# Patient Record
Sex: Male | Born: 1980 | Race: White | Hispanic: No | Marital: Married | Smoking: Former smoker
Health system: Southern US, Community
[De-identification: ages and names within clinical notes are randomized; demographics above are authoritative.]

## PROBLEM LIST (undated history)

## (undated) DIAGNOSIS — R002 Palpitations: Secondary | ICD-10-CM

## (undated) DIAGNOSIS — F988 Other specified behavioral and emotional disorders with onset usually occurring in childhood and adolescence: Secondary | ICD-10-CM

## (undated) DIAGNOSIS — K219 Gastro-esophageal reflux disease without esophagitis: Secondary | ICD-10-CM

## (undated) DIAGNOSIS — I4510 Unspecified right bundle-branch block: Secondary | ICD-10-CM

## (undated) DIAGNOSIS — Z973 Presence of spectacles and contact lenses: Secondary | ICD-10-CM

## (undated) DIAGNOSIS — Q676 Pectus excavatum: Secondary | ICD-10-CM

## (undated) HISTORY — PX: NOSE SURGERY: SHX723

## (undated) HISTORY — DX: Palpitations: R00.2

---

## 2016-10-20 ENCOUNTER — Encounter: Payer: Self-pay | Admitting: Cardiovascular Disease

## 2016-10-20 ENCOUNTER — Telehealth: Payer: Self-pay | Admitting: Cardiovascular Disease

## 2016-10-20 ENCOUNTER — Ambulatory Visit (INDEPENDENT_AMBULATORY_CARE_PROVIDER_SITE_OTHER): Payer: Commercial Managed Care - PPO | Admitting: Cardiovascular Disease

## 2016-10-20 VITALS — BP 128/89 | HR 92 | Ht 70.0 in | Wt 188.4 lb

## 2016-10-20 DIAGNOSIS — R002 Palpitations: Secondary | ICD-10-CM

## 2016-10-20 DIAGNOSIS — Q676 Pectus excavatum: Secondary | ICD-10-CM | POA: Diagnosis not present

## 2016-10-20 DIAGNOSIS — R0789 Other chest pain: Secondary | ICD-10-CM | POA: Diagnosis not present

## 2016-10-20 DIAGNOSIS — I4510 Unspecified right bundle-branch block: Secondary | ICD-10-CM

## 2016-10-20 NOTE — Telephone Encounter (Signed)
[facility] November 10, 2016

## 2016-10-20 NOTE — Progress Notes (Signed)
CARDIOLOGY CONSULT NOTE  Patient ID: Marcus Ellis MRN: [id number] DOB/AGE: Aug 09, 1980 36 y.o.  Admit date: (Not on file) Primary Physician: Selinda Flavin, MD Referring Physician: Dimas Aguas  Reason for Consultation: palpitations, abnormal ECG  HPI: Marcus Ellis is a 36 y.o. male who is being seen today for the evaluation of palpitations and an abnormal ECG at the request of Selinda Flavin, MD.   ECG performed on 10/15/16 which I personally interpreted demonstrated sinus rhythm with right bundle branch block. There were nonspecific Q waves.  He has a history of pectus excavatum. He is a Engineer, civil (consulting) by profession. He has had palpitations off and on for the past 2 years but has experienced them moreso in the past one month. He said "I am conscious of my heart the time". He thinks some of this may be related to anxiety.   He had ECGs performed on himself while at work which showed sinus rhythm with a right bundle branch block. He has never captured any arrhythmias per se.   He was once participating in an activity in [place] outdoors where it was hot and humid and he noted his heart rate to be 130 bpm by his watch. He tried adequately hydrate himself that day.   He has been experiencing a "discomfort "in his chest but has had only one episode of mild chest pain. This did not occur with exertion. He describes it as a uncomfortable feeling in his chest. He denies associated shortness of breath, lightheadedness, dizziness, syncope, and leg edema. He has never worn a monitor per se. Allograft he used to drink a lot of caffeine but now only drinks one coffee every morning. He used to drink a 15 ounce energy drink and 2 Cokes daily.  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    Allergies  Allergen Reactions  . Bee Venom Hives  . Penicillins Other (See Comments)    Happened as a child. Unknown     Current Outpatient Prescriptions  Medication Sig Dispense Refill  .  amphetamine-dextroamphetamine (ADDERALL) 20 MG tablet Take 20 mg by mouth daily.     No current facility-administered medications for this visit.     Past Medical History:  Diagnosis Date  . Palpitations     Past Surgical History:  Procedure Laterality Date  . NOSE SURGERY      Social History   Social History  . Marital status: Married    Spouse name: N/A  . Number of children: N/A  . Years of education: N/A   Occupational History  . Not on file.   Social History Main Topics  . Smoking status: Never Smoker  . Smokeless tobacco: Current User  . Alcohol use Not on file  . Drug use: Unknown  . Sexual activity: Not on file   Other Topics Concern  . Not on file   Social History Narrative  . No narrative on file     No family history of premature CAD in 1st degree relatives.  No outpatient prescriptions have been marked as taking for the 10/20/16 encounter (Office Visit) with Laqueta Linden, MD.      Review of systems complete and found to be negative unless listed above in HPI    Physical exam Blood pressure 128/89, pulse 92, height 5\' 10"  (1.778 m), weight 188 lb 6.4 oz (85.5 kg), SpO2 98 %. General: NAD Neck: No JVD, no thyromegaly or thyroid nodule.  Lungs: Clear to auscultation bilaterally with  normal respiratory effort. CV: Pectus excavatum. Nondisplaced PMI. Regular rate and rhythm, normal S1/S2, no S3/S4, no murmur.  No peripheral edema.  No carotid bruit.    Abdomen: Soft, nontender, no distention.  Skin: Intact without lesions or rashes.  Neurologic: Alert and oriented x 3.  Psych: Normal affect. Extremities: No clubbing or cyanosis.  HEENT: Normal.   ECG: Most recent ECG reviewed.   Labs: No results found for: K, BUN, CREATININE, ALT, TSH, HGB   Lipids: No results found for: LDLCALC, LDLDIRECT, CHOL, TRIG, HDL      ASSESSMENT AND PLAN:  1. Right bundle branch block: This is likely related to the anatomic position and rotation of  the heart related to pectus excavatum.  2. Chest discomfort and palpitations: I will order a 2-D echocardiogram with Doppler to evaluate cardiac structure, function, and regional wall motion. The likelihood of ischemic heart disease is exceedingly low. A stress test is not indicated. I told him if he is having forceful palpitations or rapid heart rates while working, to try and obtain an ECG in real time. He has no pulmonary symptoms to warrant pulmonary function testing.  Disposition: Follow up in 2 months  Signed: Prentice DockerSuresh Abrar Koone, M.D., F.A.C.C.  10/20/2016, 10:29 AM

## 2016-10-20 NOTE — Patient Instructions (Signed)
Medication Instructions:  Continue all current medications.  Labwork: none  Testing/Procedures:  Your physician has requested that you have an echocardiogram. Echocardiography is a painless test that uses sound waves to create images of your heart. It provides your doctor with information about the size and shape of your heart and how well your heart's chambers and valves are working. This procedure takes approximately one hour. There are no restrictions for this procedure.  Office will contact with results via phone or letter.    Follow-Up: 2 months   Any Other Special Instructions Will Be Listed Below (If Applicable).  If you need a refill on your cardiac medications before your next appointment, please call your pharmacy.  

## 2016-11-10 ENCOUNTER — Ambulatory Visit (INDEPENDENT_AMBULATORY_CARE_PROVIDER_SITE_OTHER): Payer: Commercial Managed Care - PPO

## 2016-11-10 ENCOUNTER — Other Ambulatory Visit: Payer: Self-pay

## 2016-11-10 DIAGNOSIS — R0789 Other chest pain: Secondary | ICD-10-CM

## 2016-11-10 DIAGNOSIS — I4510 Unspecified right bundle-branch block: Secondary | ICD-10-CM | POA: Diagnosis not present

## 2016-11-12 ENCOUNTER — Telehealth: Payer: Self-pay

## 2016-11-12 ENCOUNTER — Telehealth: Payer: Self-pay | Admitting: Cardiovascular Disease

## 2016-11-12 NOTE — Telephone Encounter (Signed)
Patient called requesting test results of recent echo.  °

## 2016-11-12 NOTE — Telephone Encounter (Signed)
Patient notified. Routed to PCP. Appointment made for 12/03/16

## 2016-11-12 NOTE — Telephone Encounter (Signed)
Patient informed that his echo has not been reviewed by the doctor yet but once they are reviewed that he would be contacted with the result either by email or call. Patient verbalized understanding.

## 2016-11-12 NOTE — Telephone Encounter (Signed)
-----   Message from Antoine PocheJonathan F Branch, MD sent at 11/12/2016  2:53 PM EDT ----- Echo shows a mild decrease in the pumping function of his heart. This may require some additional testing Dr Kirtland BouchardK to discuss further at f/u, can we move his appt from August to 2-3 weeks.   Dominga FerryJ Branch MD

## 2016-11-16 ENCOUNTER — Telehealth: Payer: Self-pay | Admitting: Cardiovascular Disease

## 2016-11-16 NOTE — Telephone Encounter (Signed)
Patients wife Victorino DikeJennifer notified per Dr. Wyline MoodBranch result note further testing may be required. No plan has been set in stone. Will be discussed further at follow up appointment with Dr. Purvis SheffieldKoneswaran

## 2016-11-16 NOTE — Telephone Encounter (Signed)
Patient is concerned about test results   Would like to know about what testing maybe ordered

## 2016-12-03 ENCOUNTER — Encounter: Payer: Self-pay | Admitting: Cardiovascular Disease

## 2016-12-03 ENCOUNTER — Ambulatory Visit (INDEPENDENT_AMBULATORY_CARE_PROVIDER_SITE_OTHER): Payer: Commercial Managed Care - PPO | Admitting: Cardiovascular Disease

## 2016-12-03 VITALS — BP 124/76 | HR 95 | Wt 181.0 lb

## 2016-12-03 DIAGNOSIS — R002 Palpitations: Secondary | ICD-10-CM

## 2016-12-03 DIAGNOSIS — I429 Cardiomyopathy, unspecified: Secondary | ICD-10-CM | POA: Diagnosis not present

## 2016-12-03 DIAGNOSIS — I4510 Unspecified right bundle-branch block: Secondary | ICD-10-CM | POA: Diagnosis not present

## 2016-12-03 DIAGNOSIS — R0789 Other chest pain: Secondary | ICD-10-CM | POA: Diagnosis not present

## 2016-12-03 DIAGNOSIS — Q676 Pectus excavatum: Secondary | ICD-10-CM

## 2016-12-03 NOTE — Patient Instructions (Addendum)
Your physician recommends that you schedule a follow-up appointment in: 3 months with Dr Purvis SheffieldKoneswaran   Your physician has requested that you have an exercise tolerance test. For further information please visit [email or web address]. Please also follow instruction sheet, as given.   Your physician has recommended that you have a pulmonary function test. Pulmonary Function Tests are a group of tests that measure how well air moves in and out of your lungs.   Please schedule chest CT at front desk, [facility]     You have been referred to cardiothoracic surgeon , [facility]  [address], [place], [phone], they will call you for an apt       Thank you for choosing [facility] !

## 2016-12-03 NOTE — Progress Notes (Signed)
SUBJECTIVE: The patient returns for follow-up after undergoing cardiovascular testing performed for the evaluation of chest discomfort and palpitations.  He has a history of pectus excavatum. He is a Engineer, civil (consulting) by profession.  Echocardiogram 11/10/16 demonstrated mildly reduced left ventricular systolic function, LVEF 40-45%, diffuse hypokinesis, and ventricular septal flattening in systole suggestive of right ventricular pressure overload.  That being said, there was no evidence of significant tricuspid regurgitation and right ventricular cavity size was normal.  He is here with his wife. They both have several questions.  He said his chest pain and palpitations have improved. He denies lightheadedness, dizziness, and syncope. He remains very physically active and is able to exercise without difficulty. He was mowing his lawn and they live [place] and when he was walking uphill he questioned whether he was short of breath or not, or whether he was just anxious about the echocardiogram results and this led to shortness of breath.  He tells me lipids have been checked and were normal by his PCP.   Review of Systems: As per "subjective", otherwise negative.  Allergies  Allergen Reactions  . Bee Venom Hives  . Penicillins Other (See Comments)    Happened as a child. Unknown     Current Outpatient Prescriptions  Medication Sig Dispense Refill  . amphetamine-dextroamphetamine (ADDERALL) 20 MG tablet Take 20 mg by mouth daily.     No current facility-administered medications for this visit.     Past Medical History:  Diagnosis Date  . Palpitations     Past Surgical History:  Procedure Laterality Date  . NOSE SURGERY      Social History   Social History  . Marital status: Married    Spouse name: N/A  . Number of children: N/A  . Years of education: N/A   Occupational History  . Not on file.   Social History Main Topics  . Smoking status: Never Smoker  . Smokeless  tobacco: Current User  . Alcohol use Not on file  . Drug use: Unknown  . Sexual activity: Not on file   Other Topics Concern  . Not on file   Social History Narrative  . No narrative on file     There were no vitals filed for this visit.  Wt Readings from Last 3 Encounters:  10/20/16 188 lb 6.4 oz (85.5 kg)     PHYSICAL EXAM General: NAD HEENT: Normal. Neck: No JVD, no thyromegaly. Lungs: Clear to auscultation bilaterally with normal respiratory effort. CV: Pectus excavatum. Regular rate and rhythm, normal S1/S2, no S3/S4, no murmur. No pretibial or periankle edema.   Abdomen: Soft, nontender, no distention.  Neurologic: Alert and oriented.  Psych: Normal affect. Skin: Normal. Musculoskeletal: No gross deformities.    ECG: Most recent ECG reviewed.   Labs: No results found for: K, BUN, CREATININE, ALT, TSH, HGB   Lipids: No results found for: LDLCALC, LDLDIRECT, CHOL, TRIG, HDL     ASSESSMENT AND PLAN:  1. 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 Subjectively, it does not appear to have limited exercise tolerance in that he has been able to participate in several fitness related activities.  I will obtain a GXT to objectively assess exercise tolerance. I will obtain a chest CT to quantify the degree of pectus excavatum as well as pulmonary function testing to assess for restrictive lung disease. After these studies are obtained, I will make a referral  to CT surgery to assess surgical candidacy. The pretest probability of ischemic heart disease is exceedingly low. As his cardiomyopathy appears to be mechanically related, I do not feel metoprolol succinate, carvedilol, or bisoprolol would necessarily help to improve cardiac function and may only lead to symptoms related to side effects (fatigue, etc), given his overall good health.  2. Right bundle branch block: This is likely  related to the anatomic position and rotation of the heart related to pectus excavatum.   Disposition: Follow up 3 months.  Time spent: 40 minutes, of which greater than 50% was spent reviewing symptoms, relevant blood tests and studies, and discussing management plan with the patient.    Prentice DockerSuresh Jahan Friedlander, M.D., F.A.C.C.

## 2016-12-16 ENCOUNTER — Encounter: Payer: Self-pay | Admitting: *Deleted

## 2016-12-16 ENCOUNTER — Telehealth: Payer: Self-pay | Admitting: *Deleted

## 2016-12-16 DIAGNOSIS — Q676 Pectus excavatum: Secondary | ICD-10-CM

## 2016-12-16 NOTE — Telephone Encounter (Signed)
Does this chest CT need to be with or without contrast? Both orders were placed radiology says they cannot do both (to much radiation)

## 2016-12-16 NOTE — Telephone Encounter (Signed)
A nonconctrast CT should be fine  Dominga FerryJ Christpher Stogsdill MD

## 2016-12-16 NOTE — Telephone Encounter (Signed)
Orders placed for CT without contrast

## 2016-12-17 ENCOUNTER — Ambulatory Visit (HOSPITAL_COMMUNITY)
Admission: RE | Admit: 2016-12-17 | Discharge: 2016-12-17 | Disposition: A | Discharge status: Home / Self Care | Payer: Commercial Managed Care - PPO | Source: Ambulatory Visit | Attending: Cardiovascular Disease | Admitting: Cardiovascular Disease

## 2016-12-17 DIAGNOSIS — Q676 Pectus excavatum: Secondary | ICD-10-CM

## 2016-12-17 DIAGNOSIS — I429 Cardiomyopathy, unspecified: Secondary | ICD-10-CM | POA: Diagnosis present

## 2016-12-17 MED ORDER — ALBUTEROL SULFATE (2.5 MG/3ML) 0.083% IN NEBU
2.5000 mg | INHALATION_SOLUTION | Freq: Once | RESPIRATORY_TRACT | Status: AC
  Administered 2016-12-17: 2.5 mg via RESPIRATORY_TRACT

## 2016-12-20 ENCOUNTER — Ambulatory Visit (HOSPITAL_COMMUNITY)
Admission: RE | Admit: 2016-12-20 | Discharge: 2016-12-20 | Disposition: A | Discharge status: Home / Self Care | Payer: Commercial Managed Care - PPO | Source: Ambulatory Visit | Attending: Cardiovascular Disease | Admitting: Cardiovascular Disease

## 2016-12-20 DIAGNOSIS — I429 Cardiomyopathy, unspecified: Secondary | ICD-10-CM | POA: Diagnosis not present

## 2016-12-20 DIAGNOSIS — Q676 Pectus excavatum: Secondary | ICD-10-CM | POA: Insufficient documentation

## 2016-12-20 LAB — EXERCISE TOLERANCE TEST
CSEPED: 12 min
CSEPEW: 13.4 METS
CSEPHR: 91 %
Exercise duration (sec): 0 s
MPHR: 186 {beats}/min
Peak HR: 171 {beats}/min
RPE: 14
Rest HR: 86 {beats}/min

## 2016-12-21 LAB — PULMONARY FUNCTION TEST
DL/VA % pred: 46 %
DL/VA: 2.15 ml/min/mmHg/L
DLCO UNC: 8.67 ml/min/mmHg
DLCO cor % pred: 26 %
DLCO cor: 8.67 ml/min/mmHg
DLCO unc % pred: 26 %
FEF 25-75 Post: 5.14 L/sec
FEF 25-75 Pre: 4.38 L/sec
FEF2575-%Change-Post: 17 %
FEF2575-%Pred-Post: 120 %
FEF2575-%Pred-Pre: 102 %
FEV1-%CHANGE-POST: 2 %
FEV1-%PRED-POST: 103 %
FEV1-%Pred-Pre: 100 %
FEV1-POST: 4.5 L
FEV1-PRE: 4.38 L
FEV1FVC-%Change-Post: 5 %
FEV1FVC-%Pred-Pre: 101 %
FEV6-%Change-Post: -2 %
FEV6-%PRED-PRE: 100 %
FEV6-%Pred-Post: 97 %
FEV6-POST: 5.18 L
FEV6-Pre: 5.33 L
FEV6FVC-%PRED-POST: 102 %
FEV6FVC-%Pred-Pre: 102 %
FVC-%CHANGE-POST: -2 %
FVC-%Pred-Post: 95 %
FVC-%Pred-Pre: 98 %
FVC-POST: 5.18 L
FVC-Pre: 5.33 L
POST FEV6/FVC RATIO: 100 %
PRE FEV6/FVC RATIO: 100 %
Post FEV1/FVC ratio: 87 %
Pre FEV1/FVC ratio: 82 %
RV % PRED: 96 %
RV: 1.66 L
TLC % PRED: 97 %
TLC: 6.71 L

## 2016-12-22 ENCOUNTER — Telehealth: Payer: Self-pay

## 2016-12-22 NOTE — Telephone Encounter (Signed)
No answer, left message for pt to return call. 

## 2016-12-22 NOTE — Telephone Encounter (Signed)
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

## 2016-12-24 ENCOUNTER — Ambulatory Visit: Payer: Commercial Managed Care - PPO | Admitting: Cardiovascular Disease

## 2017-01-11 ENCOUNTER — Encounter: Payer: Commercial Managed Care - PPO | Admitting: Cardiothoracic Surgery

## 2017-01-12 ENCOUNTER — Encounter: Payer: Commercial Managed Care - PPO | Admitting: Cardiothoracic Surgery

## 2017-01-13 ENCOUNTER — Encounter: Payer: Self-pay | Admitting: Cardiothoracic Surgery

## 2017-01-13 ENCOUNTER — Other Ambulatory Visit: Payer: Self-pay | Admitting: *Deleted

## 2017-01-13 ENCOUNTER — Institutional Professional Consult (permissible substitution) (INDEPENDENT_AMBULATORY_CARE_PROVIDER_SITE_OTHER): Payer: Commercial Managed Care - PPO | Admitting: Cardiothoracic Surgery

## 2017-01-13 VITALS — BP 140/90 | HR 96 | Resp 20 | Ht 71.0 in | Wt 180.0 lb

## 2017-01-13 DIAGNOSIS — Q676 Pectus excavatum: Secondary | ICD-10-CM | POA: Diagnosis not present

## 2017-01-13 DIAGNOSIS — Z01818 Encounter for other preprocedural examination: Secondary | ICD-10-CM

## 2017-01-13 DIAGNOSIS — R942 Abnormal results of pulmonary function studies: Secondary | ICD-10-CM

## 2017-01-13 NOTE — Progress Notes (Signed)
[address]       [place] [address]             [phone]                    Ching Rabideau [facility] Medical Record #[id number] Date of Birth: [birth year]  Referring: Laqueta Linden, MD Primary Care: Selinda Flavin, MD  Chief Complaint:    Chief Complaint  Patient presents with  . Advice Only    Surgical eval on pectus excavatum, Chest CT 12/17/16    History of Present Illness:    Marcus Ellis 36 y.o. male is seen in the office  today for evaluation of recent history of palpitations and exertional dyspnea. The patient has a history of known pectus excavatum and hasn't been evaluated multiple times in the past including in 2008 well he was in the [employer]. He notes his pulmonary function studies were normal at that time that we do not have copies of any this medical records. The patient works as a Engineer, civil (consulting) and at work 1 day. EKG on himself and noticed a right bundle branch block, he did this because he noted palpitations and increasing shortness of breath with exertion. Cardiology evaluation was performed including echocardiogram which showed a decreased ejection fraction 40-50%. CT scan of the chest was performed demonstrating a Haller index of 3.7. Stress test was negative for ischemia. PFTs were done demonstrating normal volumes and significantly decreased diffusion capacity. The patient is referred for consideration of pectus repair.   The patient is a previous smoker, up to a pack a day for 3-5 years quit in 2006. Growing up visit child he denies any asthmatic lung disease. Participated in sports including basketball wrestling and was always able to keep up with his peers.    Current Activity/ Functional Status:  Patient is independent with mobility/ambulation, transfers, ADL's, IADL's.   Zubrod Score: At the time of surgery this patient's most appropriate activity status/level should be described as: [x]     0    Normal activity, no symptoms []      1    Restricted in physical strenuous activity but ambulatory, able to do out light work []     2    Ambulatory and capable of self care, unable to do work activities, up and about               >50 % of waking hours                              []     3    Only limited self care, in bed greater than 50% of waking hours []     4    Completely disabled, no self care, confined to bed or chair []     5    Moribund   Past Medical History:  Diagnosis Date  . Palpitations     Past Surgical History:  Procedure Laterality Date  . NOSE SURGERY      Family History  Problem Relation Age of Onset  . High blood pressure Mother   . High blood pressure Father     Social History   Social History  . Marital status: Married    Spouse name: N/A  . Number of children: N/A  . Years of education: N/A   Occupational History  . Not on file.   Social History Main Topics  .  Smoking status: Smoked 3-5 years quit 2006   . Smokeless tobacco: Current User  . Alcohol use Not on file  . Drug use: Unknown  . Sexual activity: Not on file   Other Topics Concern  . Not on file   Social History Narrative  . No narrative on file    History  Smoking Status  . Never Smoker  Smokeless Tobacco  . Current User    History  Alcohol use Not on file     Allergies  Allergen Reactions  . Bee Venom Hives  . Penicillins Other (See Comments)    Happened as a child. Unknown     Current Outpatient Prescriptions  Medication Sig Dispense Refill  . amphetamine-dextroamphetamine (ADDERALL) 20 MG tablet Take 20 mg by mouth daily.     No current facility-administered medications for this visit.     Pertinent items are noted in HPI.   Review of Systems:     Cardiac Review of Systems: Y or N  Chest Pain [ y   ]  Resting SOB Cove.Etienne   ] Exertional SOB  Cove.Etienne  ]  Orthopnea Cove.Etienne  ]   Pedal Edema [ n  ]    Palpitations [ y ] Syncope  [ n ]   Presyncope [n   ]  General Review of Systems: [Y] = yes [   ]=no Constitional: recent weight change [  ];  Wt loss over the last 3 months [   ] anorexia [  ]; fatigue [  ]; nausea [  ]; night sweats [  ]; fever [  ]; or chills [  ];          Dental: poor dentition[  ]; Last Dentist visit:   Eye : blurred vision [  ]; diplopia [   ]; vision changes [  ];  Amaurosis fugax[  ]; Resp: cough [  ];  wheezing[ n ];  hemoptysis[ n ]; shortness of breath[ y ]; paroxysmal nocturnal dyspnea[  ]; dyspnea on exertion[  ]; or orthopnea[  ];  GI:  gallstones[  ], vomiting[  ];  dysphagia[  ]; melena[  ];  hematochezia [  ]; heartburn[  ];   Hx of  Colonoscopy[  ]; GU: kidney stones [  ]; hematuria[  ];   dysuria [  ];  nocturia[  ];  history of     obstruction [  ]; urinary frequency [  ]             Skin: rash, swelling[  ];, hair loss[  ];  peripheral edema[  ];  or itching[  ]; Musculosketetal: myalgias[  ];  joint swelling[  ];  joint erythema[  ];  joint pain[  ];  back pain[  ];  Heme/Lymph: bruising[  ];  bleeding[  ];  anemia[  ];  Neuro: TIA[  ];  headaches[  ];  stroke[  ];  vertigo[  ];  seizures[  ];   paresthesias[  ];  difficulty walking[  ];  Psych:depression[  ]; anxiety[ y ];  Endocrine: diabetes[ n ];  thyroid dysfunction[ n ];  Immunizations: Flu up to date [  ]; Pneumococcal up to date [  ];  Other:  Physical Exam: BP 140/90   Pulse 96   Resp 20   Ht 5\' 11"  (1.803 m)   Wt 180 lb (81.6 kg)   SpO2 98% Comment: RA  BMI 25.10 kg/m   PHYSICAL EXAMINATION: General appearance: alert, cooperative,  appears stated age and no distress Head: Normocephalic, without obvious abnormality, atraumatic Neck: no adenopathy, no carotid bruit, no JVD, supple, symmetrical, trachea midline and thyroid not enlarged, symmetric, no tenderness/mass/nodules Lymph nodes: Cervical, supraclavicular, and axillary nodes normal. Resp: clear to auscultation bilaterally Back: symmetric, no curvature. ROM normal. No CVA tenderness. Cardio: regular rate and rhythm, S1, S2  normal, no murmur, click, rub or gallop GI: soft, non-tender; bowel sounds normal; no masses,  no organomegaly Extremities: extremities normal, atraumatic, no cyanosis or edema and Homans sign is negative, no sign of DVT Neurologic: Grossly normal Chest:      Diagnostic Studies & Laboratory data:     Recent Radiology Findings:   Ct Chest Wo Contrast  Addendum Date: 12/21/2016   ADDENDUM REPORT: 12/21/2016 12:59 ADDENDUM: Addendum created to address request for Haller index: Transverse diameter of thorax:  (image 106) AP diameter of thorax: 76mm Haller Index: 3.7 Electronically Signed   By: Gilmer Mor D.O.   On: 12/21/2016 12:59   Result Date: 12/21/2016 CLINICAL DATA:  36 year old male with a history of pectus excavatum. Shortness of breath EXAM: CT CHEST WITHOUT CONTRAST TECHNIQUE: Multidetector CT imaging of the chest was performed following the standard protocol without IV contrast. COMPARISON:  None. FINDINGS: Cardiovascular: No significant vascular findings. Normal heart size. No pericardial effusion. Mediastinum/Nodes: No enlarged mediastinal or axillary lymph nodes. Thyroid gland, trachea, and esophagus demonstrate no significant findings. 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 Lungs/Pleura: Lungs are clear. No pleural effusion or pneumothorax. Upper Abdomen: No acute abnormality. Musculoskeletal: No chest wall mass or suspicious bone lesions identified. IMPRESSION: No acute CT finding. Electronically Signed: By: Gilmer Mor D.O. On: 12/17/2016 16:42  I have independently reviewed the above radiologic studies.    PFT's  FEV1 4.38 100% DLCO 8.67  26% Interpretation: 1. spirometry is normal.no bronchodilator improvement. 2.lung volumes are normal 3. airway resistance is elevated. 4.DLCO severely reduced    echocardiogram Transthoracic Echocardiography  Patient:    Pat, Sires MR #:       [id number] Study Date: 11/10/2016 Gender:     M Age:         36 Height:     177.8 cm Weight:     85.5 kg BSA:        2.07 m^2 Pt. Status: Room:   SONOGRAPHER  [facility]  ATTENDING    Prentice Docker, MD  ORDERING     Prentice Docker, MD  REFERRING    Prentice Docker, MD  PERFORMING   [facility], [place]  cc:  ------------------------------------------------------------------- LV EF: 40% -   45%  ------------------------------------------------------------------- History:   PMH:   Chest pain. RBBB.  Risk factors:  Lifelong nonsmoker.  ------------------------------------------------------------------- Study Conclusions  - Left ventricle: The cavity size was normal. Wall thickness was   normal. Systolic function was mildly to moderately reduced. The   estimated ejection fraction was in the range of 40% to 45%.   Diffuse hypokinesis. The study is not technically sufficient to   allow evaluation of LV diastolic function. There was no evidence   of elevated ventricular filling pressure by Doppler parameters. - Aortic valve: Valve area (VTI): 3.85 cm^2. Valve area (Vmax):   3.12 cm^2. Valve area (Vmean): 3.22 cm^2. - Right ventricle: The ventricular septum is flattened in systole,   suggesting RV pressure overload.  ------------------------------------------------------------------- Study data:  No prior study was available for comparison.  Study status:  Routine.  Procedure:  The patient reported no pain pre or post  test. Transthoracic echocardiography. Image quality was adequate.  Study completion:  There were no complications. Transthoracic echocardiography.  M-mode, complete 2D, spectral Doppler, and color Doppler.  Birthdate:  Patient birthdate: 1980/10/18.  Age:  Patient is 36 yr old.  Sex:  Gender: male. BMI: 27 kg/m^2.  Blood pressure:     128/89  Patient status: Outpatient.  Study date:  Study date: 11/10/2016. Study time:  04:21 PM.  -------------------------------------------------------------------  ------------------------------------------------------------------- Left ventricle:  The cavity size was normal. Wall thickness was normal. Systolic function was mildly to moderately reduced. The estimated ejection fraction was in the range of 40% to 45%. Diffuse hypokinesis. The study is not technically sufficient to allow evaluation of LV diastolic function. There was no evidence of elevated ventricular filling pressure by Doppler parameters.  ------------------------------------------------------------------- Aortic valve:   Trileaflet; normal thickness leaflets.  Doppler: There was no stenosis.   There was no significant regurgitation. VTI ratio of LVOT to aortic valve: 0.85. Valve area (VTI): 3.85 cm^2. Indexed valve area (VTI): 1.86 cm^2/m^2. Peak velocity ratio of LVOT to aortic valve: 0.69. Valve area (Vmax): 3.12 cm^2. Indexed valve area (Vmax): 1.51 cm^2/m^2. Mean velocity ratio of LVOT to aortic valve: 0.71. Valve area (Vmean): 3.22 cm^2. Indexed valve area (Vmean): 1.56 cm^2/m^2.    Mean gradient (S): 2 mm Hg. Peak gradient (S): 4 mm Hg.  ------------------------------------------------------------------- Aorta:  Aortic root: The aortic root was normal in size.  ------------------------------------------------------------------- Mitral valve:   Normal thickness leaflets .  Doppler:   There was no evidence for stenosis.   There was no significant regurgitation.   ------------------------------------------------------------------- Left atrium:  The atrium was normal in size.  ------------------------------------------------------------------- Atrial septum:  Poorly visualized.  ------------------------------------------------------------------- Right ventricle:  The ventricular septum is flattened in systole, suggesting RV pressure overload. The cavity size was normal. Systolic  function was normal.  ------------------------------------------------------------------- Pulmonic valve:   Not well visualized.  Doppler:   There was no evidence for stenosis.   There was no significant regurgitation.   ------------------------------------------------------------------- Tricuspid valve:   Normal thickness leaflets.  Doppler:   There was no evidence for stenosis.   There was trivial regurgitation.  ------------------------------------------------------------------- Pulmonary artery:    Systolic pressure could not be accurately estimated.   Inadequate TR jet.  ------------------------------------------------------------------- Right atrium:  The atrium was normal in size.  ------------------------------------------------------------------- Pericardium:  Not well visualized. There was no pericardial effusion.  ------------------------------------------------------------------- Measurements   Left ventricle                           Value           Reference  LV ID, ED, PLAX chordal                  47.5   mm       43 - 52  LV ID, ES, PLAX chordal                  37.4   mm       23 - 38  LV fx shortening, PLAX chordal    (L)    21     %        >=29  LV PW thickness, ED                      9      mm       ---------  IVS/LV PW ratio, ED  0.95            <=1.3  Longitudinal strain, TDI                 15     %        ---------    Ventricular septum                       Value           Reference  IVS thickness, ED                        8.52   mm       ---------    LVOT                                     Value           Reference  LVOT ID, S                               24     mm       ---------  LVOT area                                4.52   cm^2     ---------  LVOT peak velocity, S                    71.7   cm/s     ---------  LVOT mean velocity, S                    48.3   cm/s     ---------  LVOT VTI, S                               12.6   cm       ---------    Aortic valve                             Value           Reference  Aortic valve peak velocity, S            104.62 cm/s     ---------  Aortic valve mean velocity, S            67.8   cm/s     ---------  Aortic valve VTI, S                      14.86  cm       ---------  Aortic mean gradient, S                  2      mm Hg    ---------  Aortic peak gradient, S                  4      mm Hg    ---------  VTI ratio, LVOT/AV  0.85            ---------  Aortic valve area, VTI                   3.85   cm^2     ---------  Aortic valve area/bsa, VTI               1.86   cm^2/m^2 ---------  Velocity ratio, peak, LVOT/AV            0.69            ---------  Aortic valve area, peak velocity         3.12   cm^2     ---------  Aortic valve area/bsa, peak              1.51   cm^2/m^2 ---------  velocity  Velocity ratio, mean, LVOT/AV            0.71            ---------  Aortic valve area, mean velocity         3.22   cm^2     ---------  Aortic valve area/bsa, mean              1.56   cm^2/m^2 ---------  velocity    Aorta                                    Value           Reference  Aortic root ID, ED                       34     mm       ---------    Left atrium                              Value           Reference  LA ID, A-P, ES                           29     mm       ---------  LA volume/bsa, ES, 1-p A4C               15.6   ml/m^2   ---------    Mitral valve                             Value           Reference  Mitral E-wave peak velocity              43.2   cm/s     ---------  Mitral A-wave peak velocity              48     cm/s     ---------  Mitral deceleration time                 158    ms       150 - 230  Mitral E/A ratio, peak                   0.9             ---------  Mitral maximal regurg velocity,          99.1   cm/s     ---------  PISA    Right ventricle                          Value           Reference  TAPSE                                     18     mm       ---------  RV s&', lateral, S                        13.2   cm/s     ---------  Legend: (L)  and  (H)  mark values outside specified reference range.  ------------------------------------------------------------------- Prepared and Electronically Authenticated by  Patrick Jupiter, M.D. 2018-06-20T18:50:41  Study Highlights    Blood pressure demonstrated a normal response to exercise.  There was no ST segment deviation noted during stress. Negative ekg stress test for ischemia  Duke treadmill score of 12 consistent with low risk for major cardiac events.    Stress Findings   ECG Baseline ECG exhibits normal sinus rhythm.Baseline ECG indicates right bundle branch block. .    Stress Findings The patient exercised following the Bruce protocol.  The patient reported no symptoms during the stress test. The patient experienced no angina during the stress test.   Test was stopped per protocol.   Blood pressure and heart rate demonstrated a normal response to exercise. Blood pressure demonstrated a normal response to exercise. Overall, the patient's exercise capacity was normal.   85% of maximum heart rate was achieved after 10 minutes. Recovery time: 7.3 minutes. The patient's response to exercise was adequate for diagnosis.    Response to Stress There was no ST segment deviation noted during stress.  Arrhythmias during stress: none.  Arrhythmias during recovery: none.  There were no significant arrhythmias noted during the test.  ECG was interpretable and conclusive.    Stress Measurements   Baseline Vitals  Rest HR 86 bpm    Rest BP 130/92 mmHg    Exercise Time  Exercise duration (min) 12 min    Exercise duration (sec) 0 sec    Peak Stress Vitals  Peak HR 171 bpm    Peak BP 173/99 mmHg    Exercise Data  MPHR 186 bpm    Percent HR 91 %    RPE 14     Estimated workload 13.4 METS       Signed   Electronically  signed by Antoine Poche, MD on 12/20/16 at 1422 EDT     Recent Lab Findings: No results found for: WBC, HGB, HCT, PLT, GLUCOSE, CHOL, TRIG, HDL, LDLDIRECT, LDLCALC, ALT, AST, NA, K, CL, CREATININE, BUN, CO2, TSH, INR, GLUF, HGBA1C    Assessment / Plan:   Patient with pectus excavatum with a Haller index of 3.7, significant diffusion capacity deficit on PFTs with normal volumes, depressed ejection fraction 40%. I've examined the patient and discussed with him the severity of his pectus. It's unclear if this diffusion capacity is related to the pectus or other causes. To further evaluate this we will obtain CPX testing. I discussed with the patient the surgical treatments including Nuss bar and modified Ravitch  type procedure. Also told the patient , but [facility] in [place] was the center of Nuss bar development. I will see him back after the CPX testing is completed and make further recommendations at that time.       I  spent 40 minutes counseling the patient face to face and 50% or more the  time was spent in counseling and coordination of care. The total time spent in the appointment was 60 minutes.  Delight Ovens MD      [address] [place] [place] [address] Office [phone]   Beeper [phone]  01/13/2017 11:31 AM

## 2017-02-01 ENCOUNTER — Ambulatory Visit (HOSPITAL_COMMUNITY): Payer: Commercial Managed Care - PPO | Attending: Cardiothoracic Surgery

## 2017-02-01 DIAGNOSIS — Z01818 Encounter for other preprocedural examination: Secondary | ICD-10-CM | POA: Insufficient documentation

## 2017-02-01 DIAGNOSIS — Q676 Pectus excavatum: Secondary | ICD-10-CM

## 2017-02-01 DIAGNOSIS — M954 Acquired deformity of chest and rib: Secondary | ICD-10-CM | POA: Insufficient documentation

## 2017-02-01 DIAGNOSIS — R942 Abnormal results of pulmonary function studies: Secondary | ICD-10-CM

## 2017-02-02 DIAGNOSIS — Q676 Pectus excavatum: Secondary | ICD-10-CM | POA: Diagnosis not present

## 2017-02-03 ENCOUNTER — Encounter: Payer: Self-pay | Admitting: Cardiothoracic Surgery

## 2017-02-03 ENCOUNTER — Ambulatory Visit (INDEPENDENT_AMBULATORY_CARE_PROVIDER_SITE_OTHER): Payer: Commercial Managed Care - PPO | Admitting: Cardiothoracic Surgery

## 2017-02-03 VITALS — BP 130/86 | HR 87 | Resp 16 | Ht 71.0 in | Wt 180.0 lb

## 2017-02-03 DIAGNOSIS — Q676 Pectus excavatum: Secondary | ICD-10-CM

## 2017-02-03 NOTE — Progress Notes (Signed)
[address]       [place] [address]             [phone]                    JAXSUN CIAMPI [facility] Medical Record #[id number] Date of Birth: December 22, 1980  Referring: Selinda Flavin, MD Primary Care: Selinda Flavin, MD  Chief Complaint:    Chief Complaint  Patient presents with  . Follow-up    after CPX testing    History of Present Illness:    Marcus Ellis 36 y.o. male is seen in the office  today for evaluation of recent history of palpitations and exertional dyspnea. The patient has a history of known pectus excavatum and hasn't been evaluated multiple times in the past including in 2008 well he was in the [employer]. He notes his pulmonary function studies were normal at that time , He provided copies today. The patient works as a Engineer, civil (consulting) and at work 1 day. EKG on himself and noticed a right bundle branch block, he did this because he noted palpitations and increasing shortness of breath with exertion. Cardiology evaluation was performed including echocardiogram which showed a decreased ejection fraction 40-50%. CT scan of the chest was performed demonstrating a Haller index of 3.7. Stress test was negative for ischemia. PFTs were done demonstrating normal volumes and significantly decreased diffusion capacity. The patient is referred for consideration of pectus repair.   Cardiopulmonary stress test has been preformed.  The patient is a previous smoker, up to a pack a day for 3-5 years quit in 2006. Growing up visit child he denies any asthmatic lung disease. Participated in sports including basketball wrestling and was always able to keep up with his peers.    Current Activity/ Functional Status:  Patient is independent with mobility/ambulation, transfers, ADL's, IADL's.   Zubrod Score: At the time of surgery this patient's most appropriate activity status/level should be described as: [x]     0    Normal activity, no symptoms []     1     Restricted in physical strenuous activity but ambulatory, able to do out light work []     2    Ambulatory and capable of self care, unable to do work activities, up and about               >50 % of waking hours                              []     3    Only limited self care, in bed greater than 50% of waking hours []     4    Completely disabled, no self care, confined to bed or chair []     5    Moribund   Past Medical History:  Diagnosis Date  . Palpitations     Past Surgical History:  Procedure Laterality Date  . NOSE SURGERY      Family History  Problem Relation Age of Onset  . High blood pressure Mother   . High blood pressure Father     Social History   Social History  . Marital status: Married    Spouse name: N/A  . Number of children: N/A  . Years of education: N/A   Occupational History  . Not on file.   Social History Main Topics  . Smoking status: Smoked 3-5  years quit 2006   . Smokeless tobacco: Current User  . Alcohol use Not on file  . Drug use: Unknown  . Sexual activity: Not on file   Other Topics Concern  . Not on file   Social History Narrative  . No narrative on file    History  Smoking Status  . Never Smoker  Smokeless Tobacco  . Current User    History  Alcohol use Not on file     Allergies  Allergen Reactions  . Bee Venom Hives  . Penicillins Other (See Comments)    Happened as a child. Unknown     Current Outpatient Prescriptions  Medication Sig Dispense Refill  . amphetamine-dextroamphetamine (ADDERALL) 20 MG tablet Take 20 mg by mouth daily.     No current facility-administered medications for this visit.     Pertinent items are noted in HPI.   Review of Systems:     Cardiac Review of Systems: Y or N  Chest Pain [ y   ]  Resting SOB Cove.Etienne   ] Exertional SOB  Cove.Etienne  ]  Orthopnea Cove.Etienne  ]   Pedal Edema [ n  ]    Palpitations [ y ] Syncope  [ n ]   Presyncope [n   ]  General Review of Systems: [Y] = yes [  ]=no Constitional:  recent weight change [  ];  Wt loss over the last 3 months [   ] anorexia [  ]; fatigue [  ]; nausea [  ]; night sweats [  ]; fever [  ]; or chills [  ];          Dental: poor dentition[  ]; Last Dentist visit:   Eye : blurred vision [  ]; diplopia [   ]; vision changes [  ];  Amaurosis fugax[  ]; Resp: cough [  ];  wheezing[ n ];  hemoptysis[ n ]; shortness of breath[ y ]; paroxysmal nocturnal dyspnea[  ]; dyspnea on exertion[  ]; or orthopnea[  ];  GI:  gallstones[  ], vomiting[  ];  dysphagia[  ]; melena[  ];  hematochezia [  ]; heartburn[  ];   Hx of  Colonoscopy[  ]; GU: kidney stones [  ]; hematuria[  ];   dysuria [  ];  nocturia[  ];  history of     obstruction [  ]; urinary frequency [  ]             Skin: rash, swelling[  ];, hair loss[  ];  peripheral edema[  ];  or itching[  ]; Musculosketetal: myalgias[  ];  joint swelling[  ];  joint erythema[  ];  joint pain[  ];  back pain[  ];  Heme/Lymph: bruising[  ];  bleeding[  ];  anemia[  ];  Neuro: TIA[  ];  headaches[  ];  stroke[  ];  vertigo[  ];  seizures[  ];   paresthesias[  ];  difficulty walking[  ];  Psych:depression[  ]; anxiety[ y ];  Endocrine: diabetes[ n ];  thyroid dysfunction[ n ];  Immunizations: Flu up to date [  ]; Pneumococcal up to date [  ];  Other:  Physical Exam: BP 130/86 (BP Location: Right Arm, Patient Position: Sitting, Cuff Size: Large)   Pulse 87   Resp 16   Ht  (1.803 m)   Wt 180 lb (81.6 kg)   SpO2 98% Comment: ON RA  BMI 25.10 kg/m  PHYSICAL EXAMINATION: General appearance: alert, cooperative, appears stated age and no distress Head: Normocephalic, without obvious abnormality, atraumatic Neck: no adenopathy, no carotid bruit, no JVD, supple, symmetrical, trachea midline and thyroid not enlarged, symmetric, no tenderness/mass/nodules Lymph nodes: Cervical, supraclavicular, and axillary nodes normal. Resp: clear to auscultation bilaterally Back: symmetric, no curvature. ROM normal. No CVA  tenderness. Cardio: regular rate and rhythm, S1, S2 normal, no murmur, click, rub or gallop GI: soft, non-tender; bowel sounds normal; no masses,  no organomegaly Extremities: extremities normal, atraumatic, no cyanosis or edema and Homans sign is negative, no sign of DVT Neurologic: Grossly normal Chest:      Diagnostic Studies & Laboratory data:     Recent Radiology Findings:  Addendum   ADDENDUM REPORT: 12/21/2016 12:59  ADDENDUM: Addendum created to address request for Haller index:  Transverse diameter of thorax:  (image 106)  AP diameter of thorax: 76mm  Haller Index: 3.7   Electronically Signed   By: Gilmer Mor D.O.   On: 12/21/2016 12:59   Addended by Gilmer Mor, DO on 12/21/2016 1:01 PM    Study Result   CLINICAL DATA:  36 year old male with a history of pectus excavatum. Shortness of breath  EXAM: CT CHEST WITHOUT CONTRAST  TECHNIQUE: Multidetector CT imaging of the chest was performed following the standard protocol without IV contrast.  COMPARISON:  None.  FINDINGS: Cardiovascular: No significant vascular findings. Normal heart size. No pericardial effusion.  Mediastinum/Nodes: No enlarged mediastinal or axillary lymph nodes. Thyroid gland, trachea, and esophagus demonstrate no significant findings. 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  Lungs/Pleura: Lungs are clear. No pleural effusion or pneumothorax.  Upper Abdomen: No acute abnormality.  Musculoskeletal: No chest wall mass or suspicious bone lesions identified.  IMPRESSION: No acute CT finding.  Electronically Signed: By: Gilmer Mor D.O. On: 12/17/2016 16:42          PFT's  FEV1 4.38 100% DLCO 8.67  26% Interpretation: 1. spirometry is normal.no bronchodilator improvement. 2.lung volumes are normal 3. airway resistance is elevated. 4.DLCO severely reduced    echocardiogram Transthoracic Echocardiography  Patient:     Marcus Ellis, Marcus Ellis MR #:       [id number] Study Date: 11/10/2016 Gender:     M Age:        36 Height:     177.8 cm Weight:     85.5 kg BSA:        2.07 m^2 Pt. Status: Room:   SONOGRAPHER  [facility]  ATTENDING    Prentice Docker, MD  ORDERING     Prentice Docker, MD  REFERRING    Prentice Docker, MD  PERFORMING   [facility], [place]  cc:  ------------------------------------------------------------------- LV EF: 40% -   45%  ------------------------------------------------------------------- History:   PMH:   Chest pain. RBBB.  Risk factors:  Lifelong nonsmoker.  ------------------------------------------------------------------- Study Conclusions  - Left ventricle: The cavity size was normal. Wall thickness was   normal. Systolic function was mildly to moderately reduced. The   estimated ejection fraction was in the range of 40% to 45%.   Diffuse hypokinesis. The study is not technically sufficient to   allow evaluation of LV diastolic function. There was no evidence   of elevated ventricular filling pressure by Doppler parameters. - Aortic valve: Valve area (VTI): 3.85 cm^2. Valve area (Vmax):   3.12 cm^2. Valve area (Vmean): 3.22 cm^2. - Right ventricle: The ventricular septum is flattened in systole,   suggesting RV pressure overload.  ------------------------------------------------------------------- Study data:  No prior study was available for comparison.  Study status:  Routine.  Procedure:  The patient reported no pain pre or post test. Transthoracic echocardiography. Image quality was adequate.  Study completion:  There were no complications. Transthoracic echocardiography.  M-mode, complete 2D, spectral Doppler, and color Doppler.  Birthdate:  Patient birthdate: [birth year].  Age:  Patient is 36 yr old.  Sex:  Gender: male. BMI: 27 kg/m^2.  Blood pressure:     128/89  Patient status: Outpatient.  Study date:  Study date: 11/10/2016. Study time:  04:21 PM.  -------------------------------------------------------------------  ------------------------------------------------------------------- Left ventricle:  The cavity size was normal. Wall thickness was normal. Systolic function was mildly to moderately reduced. The estimated ejection fraction was in the range of 40% to 45%. Diffuse hypokinesis. The study is not technically sufficient to allow evaluation of LV diastolic function. There was no evidence of elevated ventricular filling pressure by Doppler parameters.  ------------------------------------------------------------------- Aortic valve:   Trileaflet; normal thickness leaflets.  Doppler: There was no stenosis.   There was no significant regurgitation. VTI ratio of LVOT to aortic valve: 0.85. Valve area (VTI): 3.85 cm^2. Indexed valve area (VTI): 1.86 cm^2/m^2. Peak velocity ratio of LVOT to aortic valve: 0.69. Valve area (Vmax): 3.12 cm^2. Indexed valve area (Vmax): 1.51 cm^2/m^2. Mean velocity ratio of LVOT to aortic valve: 0.71. Valve area (Vmean): 3.22 cm^2. Indexed valve area (Vmean): 1.56 cm^2/m^2.    Mean gradient (S): 2 mm Hg. Peak gradient (S): 4 mm Hg.  ------------------------------------------------------------------- Aorta:  Aortic root: The aortic root was normal in size.  ------------------------------------------------------------------- Mitral valve:   Normal thickness leaflets .  Doppler:   There was no evidence for stenosis.   There was no significant regurgitation.   ------------------------------------------------------------------- Left atrium:  The atrium was normal in size.  ------------------------------------------------------------------- Atrial septum:  Poorly visualized.  ------------------------------------------------------------------- Right ventricle:  The ventricular septum is flattened in systole, suggesting RV pressure overload. The cavity size was normal. Systolic  function was normal.  ------------------------------------------------------------------- Pulmonic valve:   Not well visualized.  Doppler:   There was no evidence for stenosis.   There was no significant regurgitation.   ------------------------------------------------------------------- Tricuspid valve:   Normal thickness leaflets.  Doppler:   There was no evidence for stenosis.   There was trivial regurgitation.  ------------------------------------------------------------------- Pulmonary artery:    Systolic pressure could not be accurately estimated.   Inadequate TR jet.  ------------------------------------------------------------------- Right atrium:  The atrium was normal in size.  ------------------------------------------------------------------- Pericardium:  Not well visualized. There was no pericardial effusion.  ------------------------------------------------------------------- Measurements   Left ventricle                           Value           Reference  LV ID, ED, PLAX chordal                  47.5   mm       43 - 52  LV ID, ES, PLAX chordal                  37.4   mm       23 - 38  LV fx shortening, PLAX chordal    (L)    21     %        >=29  LV PW thickness, ED  9      mm       ---------  IVS/LV PW ratio, ED                      0.95            <=1.3  Longitudinal strain, TDI                 15     %        ---------    Ventricular septum                       Value           Reference  IVS thickness, ED                        8.52   mm       ---------    LVOT                                     Value           Reference  LVOT ID, S                               24     mm       ---------  LVOT area                                4.52   cm^2     ---------  LVOT peak velocity, S                    71.7   cm/s     ---------  LVOT mean velocity, S                    48.3   cm/s     ---------  LVOT VTI, S                               12.6   cm       ---------    Aortic valve                             Value           Reference  Aortic valve peak velocity, S            104.62 cm/s     ---------  Aortic valve mean velocity, S            67.8   cm/s     ---------  Aortic valve VTI, S                      14.86  cm       ---------  Aortic mean gradient, S                  2      mm Hg    ---------  Aortic peak gradient, S  4      mm Hg    ---------  VTI ratio, LVOT/AV                       0.85            ---------  Aortic valve area, VTI                   3.85   cm^2     ---------  Aortic valve area/bsa, VTI               1.86   cm^2/m^2 ---------  Velocity ratio, peak, LVOT/AV            0.69            ---------  Aortic valve area, peak velocity         3.12   cm^2     ---------  Aortic valve area/bsa, peak              1.51   cm^2/m^2 ---------  velocity  Velocity ratio, mean, LVOT/AV            0.71            ---------  Aortic valve area, mean velocity         3.22   cm^2     ---------  Aortic valve area/bsa, mean              1.56   cm^2/m^2 ---------  velocity    Aorta                                    Value           Reference  Aortic root ID, ED                       34     mm       ---------    Left atrium                              Value           Reference  LA ID, A-P, ES                           29     mm       ---------  LA volume/bsa, ES, 1-p A4C               15.6   ml/m^2   ---------    Mitral valve                             Value           Reference  Mitral E-wave peak velocity              43.2   cm/s     ---------  Mitral A-wave peak velocity              48     cm/s     ---------  Mitral deceleration time                 158    ms  150 - 230  Mitral E/A ratio, peak                   0.9             ---------  Mitral maximal regurg velocity,          99.1   cm/s     ---------  PISA    Right ventricle                          Value           Reference  TAPSE                                     18     mm       ---------  RV s&', lateral, S                        13.2   cm/s     ---------  Legend: (L)  and  (H)  mark values outside specified reference range.  ------------------------------------------------------------------- Prepared and Electronically Authenticated by  Patrick Jupiter, M.D. 2018-06-20T18:50:41  Study Highlights    Blood pressure demonstrated a normal response to exercise.  There was no ST segment deviation noted during stress. Negative ekg stress test for ischemia  Duke treadmill score of 12 consistent with low risk for major cardiac events.    Stress Findings   ECG Baseline ECG exhibits normal sinus rhythm.Baseline ECG indicates right bundle branch block. .    Stress Findings The patient exercised following the Bruce protocol.  The patient reported no symptoms during the stress test. The patient experienced no angina during the stress test.   Test was stopped per protocol.   Blood pressure and heart rate demonstrated a normal response to exercise. Blood pressure demonstrated a normal response to exercise. Overall, the patient's exercise capacity was normal.   85% of maximum heart rate was achieved after 10 minutes. Recovery time: 7.3 minutes. The patient's response to exercise was adequate for diagnosis.    Response to Stress There was no ST segment deviation noted during stress.  Arrhythmias during stress: none.  Arrhythmias during recovery: none.  There were no significant arrhythmias noted during the test.  ECG was interpretable and conclusive.    Stress Measurements   Baseline Vitals  Rest HR 86 bpm    Rest BP 130/92 mmHg    Exercise Time  Exercise duration (min) 12 min    Exercise duration (sec) 0 sec    Peak Stress Vitals  Peak HR 171 bpm    Peak BP 173/99 mmHg    Exercise Data  MPHR 186 bpm    Percent HR 91 %    RPE 14     Estimated workload 13.4 METS       Signed   Electronically  signed by Antoine Poche, MD on 12/20/16 at 1422 EDT   Narrative   Referred for: Assessment of Functional Capacity with Pectus Excavatum  Procedure: This patient underwent staged symptom-limited exercise treadmill testing using a individualized treadmill protocol with expired gas analysis metabolic evaluation during exercise.  Demographics  Age: 29 Ht. (in.) 71 Wt. (lb) 183 BMI: 25.5   Predicted Peak VO2: 42.3  Gender: Male Ht (cm) 180.3 Wt. (kg) 83    Results  Pre-Exercise PFTs  FVC 4.94  (89%)     FEV1 4.13 (91%)      FEV1/FVC 84 (101%)      MVV 173 (98%)      Exercise Time:  7:30  Speed (mph): 6.0    Grade (%): 6.0    RPE: 18  Reason stopped: Dyspnea (8/10)  Additional symptoms: lightheaded (8/10)  Resting HR: 108 Peak HR: 181  (97% age predicted max HR)  BP rest: 136/98 BP peak: 180/98  Peak VO2: 36.2 (86% predicted peak VO2)  VE/VCO2 slope: 38  OUES: 2.61  Peak RER: 1.14  Ventilatory Threshold: 29.9 (71% predicted or measured peak VO2)  Peak RR 59  Peak Ventilation: 134.5  VE/MVV: 78%  PETCO2 at peak: 29  O2pulse: 17  (89% predicted O2pulse0   Interpretation  Notes: Patient gave an excellent effort. Pulse-oximetry gradually decreased to 93% at peak exercise. Exercise was performed on on a treadmill beginning at 3.40mph and 0% grade increasing 1.0 mph and 2.0% grade every other minute.   ECG: Resting ECG in normal sinus rhythm with right bundle branch block. HR response appropriate. There were no sustained arrhythmias and no ST-T changes. BP response appropriate.   PFT: Pre-exercise spirometry was within normal limits. The MVV was normal.   CPX: Exercise testing with gas exchange demonstrates a mildly peak VO2 of 36.2 ml/kg/min (86% of the age/gender/weight matched sedentary norms). The RER of 1.14 indicates a maximal effort. When adjusted to the patient's ideal body weight of 180.3 lb (81.8 kg) the peak VO2  is 36.7 ml/kg (ibw)/min (87% of the ibw-adjusted predicted). The VE/VCO2 slope is elevated and indicates excessive dead space ventilation. The oxygen uptake efficiency slope (OUES) is mildly reduced but within normal range. The VO2 at the ventilatory threshold was normal at 71% of the predicted peak VO2. At peak exercise, the ventilation reached 78% of the measured MVV indicating ventilatory limits were nearly achieved. The O2pulse (a surrogate for stroke volume) increased with incremental exercise, reaching peak at 17 ml/beat (89% predicted).    Conclusion: Exercise testing with gas exchange demonstrates normal functional capacity when compared to matches sedentary norms. Patient nearly achieved ventilatory limits and began desaturations near peak exercise. Although PVO2 and aerobic capacity normal, elevated Ve/VCo2 slope and mild desaturations indicate increased pressures with circulatory limitation.   Test, report and preliminary impression by: Lesia Hausen, MS, ACSM-RCEP 02/02/2017 4:32 PM     Recent Lab Findings: No results found for: WBC, HGB, HCT, PLT, GLUCOSE, CHOL, TRIG, HDL, LDLDIRECT, LDLCALC, ALT, AST, NA, K, CL, CREATININE, BUN, CO2, TSH, INR, GLUF, HGBA1C    Assessment / Plan:   Patient with pectus excavatum with a Haller index of 3.7, significant diffusion capacity deficit on PFTs with normal volumes, depressed ejection fraction 40%. I've examined the patient and discussed with him the severity of his pectus.  I discussed with the patient the surgical treatments including Nuss bar and modified Ravitch type procedure. He was made aware [place] was the center of Nuss bar development.  He would like to proceed with repair here by modified Ravitch type repair in early November.    Delight Ovens MD      [address] [place].[address] [place] [address] Office [phone]   Beeper [phone]  02/03/2017 9:09 PM

## 2017-02-04 ENCOUNTER — Other Ambulatory Visit: Payer: Self-pay | Admitting: *Deleted

## 2017-02-04 DIAGNOSIS — Q676 Pectus excavatum: Secondary | ICD-10-CM

## 2017-02-23 DIAGNOSIS — Z736 Limitation of activities due to disability: Secondary | ICD-10-CM

## 2017-03-03 ENCOUNTER — Encounter: Payer: Commercial Managed Care - PPO | Admitting: Cardiothoracic Surgery

## 2017-03-07 ENCOUNTER — Encounter: Payer: Self-pay | Admitting: Cardiovascular Disease

## 2017-03-07 ENCOUNTER — Ambulatory Visit (INDEPENDENT_AMBULATORY_CARE_PROVIDER_SITE_OTHER): Payer: Commercial Managed Care - PPO | Admitting: Cardiovascular Disease

## 2017-03-07 VITALS — BP 121/87 | HR 106 | Ht 70.0 in | Wt 185.8 lb

## 2017-03-07 DIAGNOSIS — Q676 Pectus excavatum: Secondary | ICD-10-CM

## 2017-03-07 DIAGNOSIS — I4510 Unspecified right bundle-branch block: Secondary | ICD-10-CM | POA: Diagnosis not present

## 2017-03-07 DIAGNOSIS — I429 Cardiomyopathy, unspecified: Secondary | ICD-10-CM

## 2017-03-07 DIAGNOSIS — I279 Pulmonary heart disease, unspecified: Secondary | ICD-10-CM

## 2017-03-07 NOTE — Progress Notes (Signed)
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 He has been evaluated by CT surgery and the patient plans to undergo a modified Ravitch repair in early November.  He is a Engineer, civil (consulting) by profession. He is here with his wife is also in healthcare.  He has several questions about surgery in the recovery period. She was scheduled undergo surgery on November 5.  While he is apprehensive, he is committed to going through with this.    Review of Systems: As per "subjective", otherwise negative.  Allergies  Allergen Reactions  . Bee Venom Hives  . Penicillins Other (See Comments)    Happened as a child. Unknown     Current Outpatient Prescriptions  Medication Sig Dispense Refill  . amphetamine-dextroamphetamine (ADDERALL) 20 MG tablet Take 20 mg by mouth daily.     No current facility-administered medications for this visit.     Past Medical History:  Diagnosis Date  . Palpitations     Past Surgical History:  Procedure Laterality Date  . NOSE SURGERY      Social History   Social History  . Marital status: Married    Spouse name: N/A  . Number of children: N/A  . Years of education: N/A   Occupational History  . Not on file.   Social History Main Topics  . Smoking status: Never Smoker  . Smokeless tobacco: Current User    Types: Chew  . Alcohol use Not on file  . Drug use: Unknown  . Sexual activity: Not on file   Other Topics Concern  . Not on file   Social History Narrative  . No narrative on file     Vitals:   03/07/17 0807  BP: 121/87  Pulse: (!) 106  Weight: 185 lb 12.8 oz (84.3 kg)  Height:  (1.778 m)    Wt Readings from Last 3 Encounters:  03/07/17 185 lb 12.8 oz (84.3 kg)  02/03/17 180 lb (81.6 kg)  01/13/17 180 lb (81.6 kg)     PHYSICAL EXAM General: NAD HEENT: Normal. Neck: No JVD, no thyromegaly. Lungs: Clear to auscultation bilaterally with normal respiratory effort. CV:  Pectus excavatum. Regular rate and rhythm, normal S1/S2, no S3/S4, no murmur. No pretibial or periankle edema.     Abdomen: Soft, nontender, no distention.  Neurologic: Alert and oriented.  Psych: Normal affect. Skin: Normal. Musculoskeletal: No gross deformities.    ECG: Most recent ECG reviewed.   Labs: No results found for: K, BUN, CREATININE, ALT, TSH, HGB   Lipids: No results found for: LDLCALC, LDLDIRECT, CHOL, TRIG, HDL     ASSESSMENT AND PLAN:  1. Cardiomyopathy in context of pectus excavatum: He plans to undergo surgery on November 5. It is likely that the degree of pectus excavatum has compromised cardiac function. He does not have limited exercise tolerance at this time. CPX testing demonstrated elevated pulmonary pressures during exercise with a possible pulmonary vascular process. As his cardiomyopathy appears to be mechanically related, I do not feel metoprolol succinate, carvedilol, or bisoprolol would necessarily help to improve cardiac function and may only lead to symptoms related to side effects (fatigue, etc), given his overall good health. We had a very lengthy discussion regarding postoperative recovery and may be expected in the ensuing years. I told him that I am not certain whether or not cardiac function and pulmonary function will indeed improve. He plans to undergo the procedure as he does not want to wait until  an advanced age when he would no longer be a candidate for it.  2. Right bundle branch block: This is likely related to the anatomic position and rotation of the heart related to pectus excavatum.  3. Pulmonary disease: CPX testing demonstrated elevated pulmonary pressures during exercise with a possible pulmonary vascular process. I may consider having him evaluated by pulmonology.  Disposition: Follow up to be determined.  Time spent: 40 minutes, of which greater than 50% was spent reviewing symptoms, relevant blood tests and studies, and  discussing management plan with the patient.    Prentice Docker, M.D., F.A.C.C.

## 2017-03-07 NOTE — Patient Instructions (Signed)
Medication Instructions:  Your physician recommends that you continue on your current medications as directed. Please refer to the Current Medication list given to you today.  Labwork: NONE  Testing/Procedures: NONE  Follow-Up: Your physician recommends that you schedule a follow-up appointment TO BE DETERMINED   Any Other Special Instructions Will Be Listed Below (If Applicable).  If you need a refill on your cardiac medications before your next appointment, please call your pharmacy.

## 2017-03-10 ENCOUNTER — Ambulatory Visit (INDEPENDENT_AMBULATORY_CARE_PROVIDER_SITE_OTHER): Payer: Commercial Managed Care - PPO | Admitting: Cardiothoracic Surgery

## 2017-03-10 ENCOUNTER — Encounter: Payer: Self-pay | Admitting: Cardiothoracic Surgery

## 2017-03-10 VITALS — BP 128/88 | HR 94 | Ht 70.0 in | Wt 185.0 lb

## 2017-03-10 DIAGNOSIS — Q676 Pectus excavatum: Secondary | ICD-10-CM

## 2017-03-10 NOTE — Progress Notes (Signed)
[address]       [place] [id number]             [phone]                    Marcus Ellis [facility] Medical Record #[id number] Date of Birth: 06-01-1980  Referring: Laqueta LindenKoneswaran, Suresh A, MD Primary Care: Selinda FlavinHoward, Kevin, MD  Chief Complaint:    Chief Complaint  Patient presents with  . Pre-op Exam    History of Present Illness:    Marcus Ellis Erion 36 y.o. male is seen in the office  today for follow-up and presurgical visit for pectus repair November 5. He was originally seen for evaluation of recent history of palpitations and exertional dyspnea. The patient has a history of known pectus excavatum and hasn't been evaluated multiple times in the past including in 2008 well he was in the [employer]. He notes his pulmonary function studies were normal at that time , He provided copies today. The patient works as a Engineer, civil (consulting)nurse and at work 1 day. EKG on himself and noticed a right bundle branch block, he did this because he noted palpitations and increasing shortness of breath with exertion. Cardiology evaluation was performed including echocardiogram which showed a decreased ejection fraction 40-50%. CT scan of the chest was performed demonstrating a Haller index of 3.7. Stress test was negative for ischemia. PFTs were done demonstrating normal volumes and significantly decreased diffusion capacity. The patient is referred for consideration of pectus repair.   Cardiopulmonary stress test has been preformed.  The patient is a previous smoker, up to a pack a day for 3-5 years quit in 2006. Growing up visit child he denies any asthmatic lung disease. Participated in sports including basketball wrestling and was always able to keep up with his peers.    Current Activity/ Functional Status:  Patient is independent with mobility/ambulation, transfers, ADL's, IADL's.   Zubrod Score: At the time of surgery this patient's most appropriate activity status/level should be  described as: [x]     0    Normal activity, no symptoms []     1    Restricted in physical strenuous activity but ambulatory, able to do out light work []     2    Ambulatory and capable of self care, unable to do work activities, up and about               >50 % of waking hours                              []     3    Only limited self care, in bed greater than 50% of waking hours []     4    Completely disabled, no self care, confined to bed or chair []     5    Moribund   Past Medical History:  Diagnosis Date  . Palpitations     Past Surgical History:  Procedure Laterality Date  . NOSE SURGERY      Family History  Problem Relation Age of Onset  . High blood pressure Mother   . High blood pressure Father     Social History   Social History  . Marital status: Married    Spouse name: N/A  . Number of children: N/A  . Years of education: N/A   Occupational History  . Not on file.  Social History Main Topics  . Smoking status: Smoked 3-5 years quit 2006   . Smokeless tobacco: Current User  . Alcohol use Not on file  . Drug use: Unknown  . Sexual activity: Not on file   Other Topics Concern  . Not on file   Social History Narrative  . No narrative on file    History  Smoking Status  . Never Smoker  Smokeless Tobacco  . Current User  . Types: Chew    History  Alcohol use Not on file     Allergies  Allergen Reactions  . Bee Venom Hives  . Penicillins Other (See Comments)    Happened as a child. Unknown     Current Outpatient Prescriptions  Medication Sig Dispense Refill  . amphetamine-dextroamphetamine (ADDERALL) 20 MG tablet Take 20 mg by mouth daily.     No current facility-administered medications for this visit.     Pertinent items are noted in HPI.   Review of Systems:     Cardiac Review of Systems: Y or N  Chest Pain [ y   ]  Resting SOB Cove.Etienne   ] Exertional SOB  Cove.Etienne  ]  Orthopnea Cove.Etienne  ]   Pedal Edema [ n  ]    Palpitations [ y ] Syncope  [ n ]    Presyncope [n   ]  General Review of Systems: [Y] = yes [  ]=no Constitional: recent weight change [  ];  Wt loss over the last 3 months [   ] anorexia [  ]; fatigue [  ]; nausea [  ]; night sweats [  ]; fever [  ]; or chills [  ];          Dental: poor dentition[  ]; Last Dentist visit:   Eye : blurred vision [  ]; diplopia [   ]; vision changes [  ];  Amaurosis fugax[  ]; Resp: cough [  ];  wheezing[ n ];  hemoptysis[ n ]; shortness of breath[ y ]; paroxysmal nocturnal dyspnea[  ]; dyspnea on exertion[  ]; or orthopnea[  ];  GI:  gallstones[  ], vomiting[  ];  dysphagia[  ]; melena[  ];  hematochezia [  ]; heartburn[  ];   Hx of  Colonoscopy[  ]; GU: kidney stones [  ]; hematuria[  ];   dysuria [  ];  nocturia[  ];  history of     obstruction [  ]; urinary frequency [  ]             Skin: rash, swelling[  ];, hair loss[  ];  peripheral edema[  ];  or itching[  ]; Musculosketetal: myalgias[  ];  joint swelling[  ];  joint erythema[  ];  joint pain[  ];  back pain[  ];  Heme/Lymph: bruising[  ];  bleeding[  ];  anemia[  ];  Neuro: TIA[  ];  headaches[  ];  stroke[  ];  vertigo[  ];  seizures[  ];   paresthesias[  ];  difficulty walking[  ];  Psych:depression[  ]; anxiety[ y ];  Endocrine: diabetes[ n ];  thyroid dysfunction[ n ];  Immunizations: Flu up to date [  ]; Pneumococcal up to date [  ];  Other:  Physical Exam: BP 128/88   Pulse 94   Ht 5\' 10"  (1.778 m)   Wt 185 lb (83.9 kg)   SpO2 99%   BMI 26.54 kg/m   PHYSICAL  EXAMINATION: General appearance: alert, cooperative, appears stated age and no distress Head: Normocephalic, without obvious abnormality, atraumatic Neck: no adenopathy, no carotid bruit, no JVD, supple, symmetrical, trachea midline and thyroid not enlarged, symmetric, no tenderness/mass/nodules Lymph nodes: Cervical, supraclavicular, and axillary nodes normal. Resp: clear to auscultation bilaterally Back: symmetric, no curvature. ROM normal. No CVA  tenderness. Cardio: regular rate and rhythm, S1, S2 normal, no murmur, click, rub or gallop GI: soft, non-tender; bowel sounds normal; no masses,  no organomegaly Extremities: extremities normal, atraumatic, no cyanosis or edema and Homans sign is negative, no sign of DVT Neurologic: Grossly normal Chest:      Diagnostic Studies & Laboratory data:     Recent Radiology Findings:  Addendum   ADDENDUM REPORT: 12/21/2016 12:59  ADDENDUM: Addendum created to address request for Haller index:  Transverse diameter of thorax:  (image 106)  AP diameter of thorax: 76mm  Haller Index: 3.7   Electronically Signed   By: Gilmer Mor D.O.   On: 12/21/2016 12:59   Addended by Gilmer Mor, DO on 12/21/2016 1:01 PM    Study Result   CLINICAL DATA:  36 year old male with a history of pectus excavatum. Shortness of breath  EXAM: CT CHEST WITHOUT CONTRAST  TECHNIQUE: Multidetector CT imaging of the chest was performed following the standard protocol without IV contrast.  COMPARISON:  None.  FINDINGS: Cardiovascular: No significant vascular findings. Normal heart size. No pericardial effusion.  Mediastinum/Nodes: No enlarged mediastinal or axillary lymph nodes. Thyroid gland, trachea, and esophagus demonstrate no significant findings. 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  Lungs/Pleura: Lungs are clear. No pleural effusion or pneumothorax.  Upper Abdomen: No acute abnormality.  Musculoskeletal: No chest wall mass or suspicious bone lesions identified.  IMPRESSION: No acute CT finding.  Electronically Signed: By: Gilmer Mor D.O. On: 12/17/2016 16:42          PFT's  FEV1 4.38 100% DLCO 8.67  26% Interpretation: 1. spirometry is normal.no bronchodilator improvement. 2.lung volumes are normal 3. airway resistance is elevated. 4.DLCO severely reduced    echocardiogram Transthoracic Echocardiography  Patient:     Dequann, Vandervelden MR #:       [id number] Study Date: 11/10/2016 Gender:     M Age:        36 Height:     177.8 cm Weight:     85.5 kg BSA:        2.07 m^2 Pt. Status: Room:   SONOGRAPHER  [facility]  ATTENDING    Prentice Docker, MD  ORDERING     Prentice Docker, MD  REFERRING    Prentice Docker, MD  PERFORMING   [facility], [place]  cc:  ------------------------------------------------------------------- LV EF: 40% -   45%  ------------------------------------------------------------------- History:   PMH:   Chest pain. RBBB.  Risk factors:  Lifelong nonsmoker.  ------------------------------------------------------------------- Study Conclusions  - Left ventricle: The cavity size was normal. Wall thickness was   normal. Systolic function was mildly to moderately reduced. The   estimated ejection fraction was in the range of 40% to 45%.   Diffuse hypokinesis. The study is not technically sufficient to   allow evaluation of LV diastolic function. There was no evidence   of elevated ventricular filling pressure by Doppler parameters. - Aortic valve: Valve area (VTI): 3.85 cm^2. Valve area (Vmax):   3.12 cm^2. Valve area (Vmean): 3.22 cm^2. - Right ventricle: The ventricular septum is flattened in systole,   suggesting RV pressure overload.  ------------------------------------------------------------------- Study data:  No prior study was available for comparison.  Study status:  Routine.  Procedure:  The patient reported no pain pre or post test. Transthoracic echocardiography. Image quality was adequate.  Study completion:  There were no complications. Transthoracic echocardiography.  M-mode, complete 2D, spectral Doppler, and color Doppler.  Birthdate:  Patient birthdate: [birth year].  Age:  Patient is 36 yr old.  Sex:  Gender: male. BMI: 27 kg/m^2.  Blood pressure:     128/89  Patient status: Outpatient.  Study date:  Study date: 11/10/2016. Study time:  04:21 PM.  -------------------------------------------------------------------  ------------------------------------------------------------------- Left ventricle:  The cavity size was normal. Wall thickness was normal. Systolic function was mildly to moderately reduced. The estimated ejection fraction was in the range of 40% to 45%. Diffuse hypokinesis. The study is not technically sufficient to allow evaluation of LV diastolic function. There was no evidence of elevated ventricular filling pressure by Doppler parameters.  ------------------------------------------------------------------- Aortic valve:   Trileaflet; normal thickness leaflets.  Doppler: There was no stenosis.   There was no significant regurgitation. VTI ratio of LVOT to aortic valve: 0.85. Valve area (VTI): 3.85 cm^2. Indexed valve area (VTI): 1.86 cm^2/m^2. Peak velocity ratio of LVOT to aortic valve: 0.69. Valve area (Vmax): 3.12 cm^2. Indexed valve area (Vmax): 1.51 cm^2/m^2. Mean velocity ratio of LVOT to aortic valve: 0.71. Valve area (Vmean): 3.22 cm^2. Indexed valve area (Vmean): 1.56 cm^2/m^2.    Mean gradient (S): 2 mm Hg. Peak gradient (S): 4 mm Hg.  ------------------------------------------------------------------- Aorta:  Aortic root: The aortic root was normal in size.  ------------------------------------------------------------------- Mitral valve:   Normal thickness leaflets .  Doppler:   There was no evidence for stenosis.   There was no significant regurgitation.   ------------------------------------------------------------------- Left atrium:  The atrium was normal in size.  ------------------------------------------------------------------- Atrial septum:  Poorly visualized.  ------------------------------------------------------------------- Right ventricle:  The ventricular septum is flattened in systole, suggesting RV pressure overload. The cavity size was normal. Systolic  function was normal.  ------------------------------------------------------------------- Pulmonic valve:   Not well visualized.  Doppler:   There was no evidence for stenosis.   There was no significant regurgitation.   ------------------------------------------------------------------- Tricuspid valve:   Normal thickness leaflets.  Doppler:   There was no evidence for stenosis.   There was trivial regurgitation.  ------------------------------------------------------------------- Pulmonary artery:    Systolic pressure could not be accurately estimated.   Inadequate TR jet.  ------------------------------------------------------------------- Right atrium:  The atrium was normal in size.  ------------------------------------------------------------------- Pericardium:  Not well visualized. There was no pericardial effusion.  ------------------------------------------------------------------- Measurements   Left ventricle                           Value           Reference  LV ID, ED, PLAX chordal                  47.5   mm       43 - 52  LV ID, ES, PLAX chordal                  37.4   mm       23 - 38  LV fx shortening, PLAX chordal    (L)    21     %        >=29  LV PW thickness, ED  9      mm       ---------  IVS/LV PW ratio, ED                      0.95            <=1.3  Longitudinal strain, TDI                 15     %        ---------    Ventricular septum                       Value           Reference  IVS thickness, ED                        8.52   mm       ---------    LVOT                                     Value           Reference  LVOT ID, S                               24     mm       ---------  LVOT area                                4.52   cm^2     ---------  LVOT peak velocity, S                    71.7   cm/s     ---------  LVOT mean velocity, S                    48.3   cm/s     ---------  LVOT VTI, S                               12.6   cm       ---------    Aortic valve                             Value           Reference  Aortic valve peak velocity, S            104.62 cm/s     ---------  Aortic valve mean velocity, S            67.8   cm/s     ---------  Aortic valve VTI, S                      14.86  cm       ---------  Aortic mean gradient, S                  2      mm Hg    ---------  Aortic peak gradient, S  4      mm Hg    ---------  VTI ratio, LVOT/AV                       0.85            ---------  Aortic valve area, VTI                   3.85   cm^2     ---------  Aortic valve area/bsa, VTI               1.86   cm^2/m^2 ---------  Velocity ratio, peak, LVOT/AV            0.69            ---------  Aortic valve area, peak velocity         3.12   cm^2     ---------  Aortic valve area/bsa, peak              1.51   cm^2/m^2 ---------  velocity  Velocity ratio, mean, LVOT/AV            0.71            ---------  Aortic valve area, mean velocity         3.22   cm^2     ---------  Aortic valve area/bsa, mean              1.56   cm^2/m^2 ---------  velocity    Aorta                                    Value           Reference  Aortic root ID, ED                       34     mm       ---------    Left atrium                              Value           Reference  LA ID, A-P, ES                           29     mm       ---------  LA volume/bsa, ES, 1-p A4C               15.6   ml/m^2   ---------    Mitral valve                             Value           Reference  Mitral E-wave peak velocity              43.2   cm/s     ---------  Mitral A-wave peak velocity              48     cm/s     ---------  Mitral deceleration time                 158    ms  150 - 230  Mitral E/A ratio, peak                   0.9             ---------  Mitral maximal regurg velocity,          99.1   cm/s     ---------  PISA    Right ventricle                          Value           Reference  TAPSE                                     18     mm       ---------  RV s&', lateral, S                        13.2   cm/s     ---------  Legend: (L)  and  (H)  mark values outside specified reference range.  ------------------------------------------------------------------- Prepared and Electronically Authenticated by  Patrick Jupiter, M.D. 2018-06-20T18:50:41  Study Highlights    Blood pressure demonstrated a normal response to exercise.  There was no ST segment deviation noted during stress. Negative ekg stress test for ischemia  Duke treadmill score of 12 consistent with low risk for major cardiac events.    Stress Findings   ECG Baseline ECG exhibits normal sinus rhythm.Baseline ECG indicates right bundle branch block. .    Stress Findings The patient exercised following the Bruce protocol.  The patient reported no symptoms during the stress test. The patient experienced no angina during the stress test.   Test was stopped per protocol.   Blood pressure and heart rate demonstrated a normal response to exercise. Blood pressure demonstrated a normal response to exercise. Overall, the patient's exercise capacity was normal.   85% of maximum heart rate was achieved after 10 minutes. Recovery time: 7.3 minutes. The patient's response to exercise was adequate for diagnosis.    Response to Stress There was no ST segment deviation noted during stress.  Arrhythmias during stress: none.  Arrhythmias during recovery: none.  There were no significant arrhythmias noted during the test.  ECG was interpretable and conclusive.    Stress Measurements   Baseline Vitals  Rest HR 86 bpm    Rest BP 130/92 mmHg    Exercise Time  Exercise duration (min) 12 min    Exercise duration (sec) 0 sec    Peak Stress Vitals  Peak HR 171 bpm    Peak BP 173/99 mmHg    Exercise Data  MPHR 186 bpm    Percent HR 91 %    RPE 14     Estimated workload 13.4 METS       Signed   Electronically  signed by Antoine Poche, MD on 12/20/16 at 1422 EDT   Narrative   Referred for: Assessment of Functional Capacity with Pectus Excavatum  Procedure: This patient underwent staged symptom-limited exercise treadmill testing using a individualized treadmill protocol with expired gas analysis metabolic evaluation during exercise.  Demographics  Age: 29 Ht. (in.) 71 Wt. (lb) 183 BMI: 25.5   Predicted Peak VO2: 42.3  Gender: Male Ht (cm) 180.3 Wt. (kg) 83    Results  Pre-Exercise PFTs  FVC 4.94  (89%)     FEV1 4.13 (91%)      FEV1/FVC 84 (101%)      MVV 173 (98%)      Exercise Time:  7:30  Speed (mph): 6.0    Grade (%): 6.0    RPE: 18  Reason stopped: Dyspnea (8/10)  Additional symptoms: lightheaded (8/10)  Resting HR: 108 Peak HR: 181  (97% age predicted max HR)  BP rest: 136/98 BP peak: 180/98  Peak VO2: 36.2 (86% predicted peak VO2)  VE/VCO2 slope: 38  OUES: 2.61  Peak RER: 1.14  Ventilatory Threshold: 29.9 (71% predicted or measured peak VO2)  Peak RR 59  Peak Ventilation: 134.5  VE/MVV: 78%  PETCO2 at peak: 29  O2pulse: 17  (89% predicted O2pulse0   Interpretation  Notes: Patient gave an excellent effort. Pulse-oximetry gradually decreased to 93% at peak exercise. Exercise was performed on on a treadmill beginning at 3.74mph and 0% grade increasing 1.0 mph and 2.0% grade every other minute.   ECG: Resting ECG in normal sinus rhythm with right bundle branch block. HR response appropriate. There were no sustained arrhythmias and no ST-T changes. BP response appropriate.   PFT: Pre-exercise spirometry was within normal limits. The MVV was normal.   CPX: Exercise testing with gas exchange demonstrates a mildly peak VO2 of 36.2 ml/kg/min (86% of the age/gender/weight matched sedentary norms). The RER of 1.14 indicates a maximal effort. When adjusted to the patient's ideal body weight of 180.3 lb (81.8 kg) the peak VO2  is 36.7 ml/kg (ibw)/min (87% of the ibw-adjusted predicted). The VE/VCO2 slope is elevated and indicates excessive dead space ventilation. The oxygen uptake efficiency slope (OUES) is mildly reduced but within normal range. The VO2 at the ventilatory threshold was normal at 71% of the predicted peak VO2. At peak exercise, the ventilation reached 78% of the measured MVV indicating ventilatory limits were nearly achieved. The O2pulse (a surrogate for stroke volume) increased with incremental exercise, reaching peak at 17 ml/beat (89% predicted).    Conclusion: Exercise testing with gas exchange demonstrates normal functional capacity when compared to matches sedentary norms. Patient nearly achieved ventilatory limits and began desaturations near peak exercise. Although PVO2 and aerobic capacity normal, elevated Ve/VCo2 slope and mild desaturations indicate increased pressures with circulatory limitation.   Test, report and preliminary impression by: Lesia Hausen, MS, ACSM-RCEP 02/02/2017 4:32 PM     Recent Lab Findings: No results found for: WBC, HGB, HCT, PLT, GLUCOSE, CHOL, TRIG, HDL, LDLDIRECT, LDLCALC, ALT, AST, NA, K, CL, CREATININE, BUN, CO2, TSH, INR, GLUF, HGBA1C    Assessment / Plan:   Patient with pectus excavatum with a Haller index of 3.7, significant diffusion capacity deficit on PFTs with normal volumes, depressed ejection fraction 40%. I've examined the patient and discussed with him the severity of his pectus.   The risks and options were again discussed with the patient including proceeding with modified Ravitch procedure versus a Nuss bar. The patient wishes to proceed with modified Ravitch and is scheduled for November 5. He had additional questions about postop recovery which we went over today.      Delight Ovens MD      [address] [place] ,[place] [address] Office [phone]   Beeper [phone]  03/10/2017 3:13 PM

## 2017-03-11 ENCOUNTER — Ambulatory Visit: Payer: Commercial Managed Care - PPO | Admitting: Cardiovascular Disease

## 2017-03-11 ENCOUNTER — Telehealth: Payer: Self-pay

## 2017-03-11 ENCOUNTER — Telehealth: Payer: Self-pay | Admitting: Cardiovascular Disease

## 2017-03-11 NOTE — Telephone Encounter (Signed)
I called the patient to tell him that I would like him to be evaluated by pulmonology, Dr. Heber CarolinaBrent McQuaid. I am concerned that he may have an underlying pulmonary vascular process such as pulmonary hypertension. The patient is agreeable to proceeding with a formal evaluation. I also spoke with him about potential right heart catheterization.

## 2017-03-11 NOTE — Telephone Encounter (Signed)
Spoke with [facility] regarding patients surgery pre-cert.  Clinicals faxed to [phone].  Pending case # [id number].

## 2017-03-14 ENCOUNTER — Other Ambulatory Visit: Payer: Self-pay | Admitting: *Deleted

## 2017-03-14 ENCOUNTER — Telehealth: Payer: Self-pay | Admitting: Pulmonary Disease

## 2017-03-14 DIAGNOSIS — I272 Pulmonary hypertension, unspecified: Secondary | ICD-10-CM

## 2017-03-14 NOTE — Telephone Encounter (Signed)
ATC pt, no answer. Left message for pt to call back.  

## 2017-03-14 NOTE — Telephone Encounter (Signed)
BQ sent me an email about this patient to be worked in this week- scheduled with BQ on Friday morning.  Nothing further needed.

## 2017-03-14 NOTE — Telephone Encounter (Signed)
An appt can be made for this pt, BQ has openings.

## 2017-03-18 ENCOUNTER — Ambulatory Visit (INDEPENDENT_AMBULATORY_CARE_PROVIDER_SITE_OTHER): Payer: Commercial Managed Care - PPO | Admitting: Pulmonary Disease

## 2017-03-18 ENCOUNTER — Other Ambulatory Visit (INDEPENDENT_AMBULATORY_CARE_PROVIDER_SITE_OTHER): Payer: Commercial Managed Care - PPO

## 2017-03-18 ENCOUNTER — Encounter: Payer: Self-pay | Admitting: Pulmonary Disease

## 2017-03-18 ENCOUNTER — Telehealth: Payer: Self-pay | Admitting: Pulmonary Disease

## 2017-03-18 VITALS — BP 122/88 | HR 87 | Ht 70.0 in | Wt 186.8 lb

## 2017-03-18 DIAGNOSIS — R0602 Shortness of breath: Secondary | ICD-10-CM

## 2017-03-18 DIAGNOSIS — I270 Primary pulmonary hypertension: Secondary | ICD-10-CM

## 2017-03-18 LAB — PULMONARY FUNCTION TEST
DL/VA % PRED: 107 %
DL/VA: 5.03 ml/min/mmHg/L
DLCO UNC % PRED: 113 %
DLCO UNC: 36.77 ml/min/mmHg
DLCO cor % pred: 113 %
DLCO cor: 36.57 ml/min/mmHg
FEF 25-75 PRE: 4.24 L/s
FEF2575-%Pred-Pre: 99 %
FEV1-%PRED-PRE: 95 %
FEV1-Pre: 4.16 L
FEV1FVC-%PRED-PRE: 101 %
FEV6-%Pred-Pre: 94 %
FEV6-PRE: 5.03 L
FEV6FVC-%Pred-Pre: 102 %
FVC-%Pred-Pre: 93 %
FVC-Pre: 5.05 L
Pre FEV1/FVC ratio: 82 %
Pre FEV6/FVC Ratio: 100 %

## 2017-03-18 LAB — CBC WITH DIFFERENTIAL/PLATELET
BASOS ABS: 0.1 10*3/uL (ref 0.0–0.1)
Basophils Relative: 0.9 % (ref 0.0–3.0)
EOS ABS: 0.2 10*3/uL (ref 0.0–0.7)
Eosinophils Relative: 2.9 % (ref 0.0–5.0)
HEMATOCRIT: 47.2 % (ref 39.0–52.0)
Hemoglobin: 16.3 g/dL (ref 13.0–17.0)
LYMPHS ABS: 1.6 10*3/uL (ref 0.7–4.0)
Lymphocytes Relative: 27.3 % (ref 12.0–46.0)
MCHC: 34.5 g/dL (ref 30.0–36.0)
MCV: 85.2 fl (ref 78.0–100.0)
MONOS PCT: 8.1 % (ref 3.0–12.0)
Monocytes Absolute: 0.5 10*3/uL (ref 0.1–1.0)
NEUTROS ABS: 3.6 10*3/uL (ref 1.4–7.7)
Neutrophils Relative %: 60.8 % (ref 43.0–77.0)
PLATELETS: 232 10*3/uL (ref 150.0–400.0)
RBC: 5.54 Mil/uL (ref 4.22–5.81)
RDW: 12.5 % (ref 11.5–15.5)
WBC: 6 10*3/uL (ref 4.0–10.5)

## 2017-03-18 NOTE — Patient Instructions (Signed)
For the abnormal pulmonary function test, low diffusion capacity: We will repeat this test here today We will check a hemoglobin value today If this value is in fact low as seen on the recent pulmonary function test then you need to have a right heart catheterization prior to surgery.  We will arrange for these tests today

## 2017-03-18 NOTE — Telephone Encounter (Signed)
I met with the patient this afternoon after we repeated the lung function test with diffusion capacity.  Today's exam was completely within normal limits.  Given the normal lung function otherwise, normal pulmonary parenchyma on CT scanning of his chest, and normal oxygenation at this time I see no evidence of an underlying lung disease or pulmonary hypertension.  I advised the patient and his wife of this today.  In regards to consideration of a right heart cath prior to surgery, it does not appear to me that that is necessary right now.  However, as he did have some septal flattening seen on his right ventricle on echocardiogram I will leave it up to cardiology, anesthesiology, and cardiothoracic surgery to consider whether or not they feel this is necessary.  Roselie Awkward, MD [facility] Pager: [phone] Cell: [phone] After 3pm or if no response, call [phone]

## 2017-03-18 NOTE — Progress Notes (Signed)
Spiro pre and DLCO completed today.Vivianne SpenceKatie Welchel,CMA

## 2017-03-18 NOTE — Progress Notes (Signed)
Subjective:    Patient ID: Marcus Ellis, male    DOB: Oct 02, 1980, 36 y.o.   MRN: [id number]  Synopsis: Referred in 2018 for evaluation of an abnormal diffusion capacity seen on a CT scan of the chest.  He has a history of pectus excavatum.  Smoked cigarettes briefly from age [age].  HPI Chief Complaint  Patient presents with  . pulmonary consult    per Cardiology for increased lung pressure with exertion.- recent PFT 12/17/16.    Marcus Ellis is here to see me because of pectus excavatum and abnormal PFTs.  He says that three years ago he was bored a work and he performed an EKG on himself and found it to be abnormal.  After this he developed some occassional palpitations and was referred to cardiology for further evaluation.    He has noticed some shortness of breath with moderately intense exertion when he is pushing his lawnmower uphill.  He doesn't think taht this has worsened necessarily.  No pain or chest tightness.    He was in [employer] and he ran a lot.  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  He is being evaluated for repair of pectus excavatum surgery.    He thinks that he had pneumonia once a as a kid.  No asthma.  He used to smoke from age [age], quit in 2006.   He has some family history associated with smoking.    Past Medical History:  Diagnosis Date  . Palpitations      Family History  Problem Relation Age of Onset  . High blood pressure Mother   . High blood pressure Father      Social History   Social History  . Marital status: Married    Spouse name: N/A  . Number of children: N/A  . Years of education: N/A   Occupational History  . Not on file.   Social History Main Topics  . Smoking status: Former Smoker    Packs/day: 1.00    Years: 4.00  . Smokeless tobacco: Current User    Types: Chew     Comment: quit smoking in 2006  . Alcohol use Yes     Comment: occ  . Drug use: No  . Sexual  activity: Not on file   Other Topics Concern  . Not on file   Social History Narrative  . No narrative on file     Allergies  Allergen Reactions  . Bee Venom Hives  . Penicillins Other (See Comments)    Happened as a child. Unknown  Has patient had a PCN reaction causing immediate rash, facial/tongue/throat swelling, SOB or lightheadedness with hypotension: Unknown Has patient had a PCN reaction causing severe rash involving mucus membranes or skin necrosis: Unknown Has patient had a PCN reaction that required hospitalization: Unknown Has patient had a PCN reaction occurring within the last 10 years: No If all of the above answers are "NO", then may proceed with Cephalosporin use.      Outpatient Medications Prior to Visit  Medication Sig Dispense Refill  . amphetamine-dextroamphetamine (ADDERALL) 20 MG tablet Take 20 mg by mouth daily.    . calcium carbonate (TUMS - DOSED IN MG ELEMENTAL CALCIUM) 500 MG chewable tablet Chew 2 tablets by mouth 2 (two) times daily as needed for indigestion or heartburn.    Marland Kitchen. ibuprofen (ADVIL,MOTRIN) 800 MG tablet Take 800 mg by mouth every  8 (eight) hours as needed for mild pain.    . sodium-potassium bicarbonate (ALKA-SELTZER GOLD) TBEF dissolvable tablet Take 1 tablet by mouth daily as needed (indigestion).     No facility-administered medications prior to visit.       Review of Systems  Constitutional: Negative for fever and unexpected weight change.  HENT: Negative for congestion, dental problem, ear pain, nosebleeds, postnasal drip, rhinorrhea, sinus pressure, sneezing, sore throat and trouble swallowing.   Eyes: Negative for redness and itching.  Respiratory: Positive for shortness of breath. Negative for cough, chest tightness and wheezing.   Cardiovascular: Negative for palpitations and leg swelling.  Gastrointestinal: Negative for nausea and vomiting.  Genitourinary: Negative for dysuria.  Musculoskeletal: Negative for joint  swelling.  Skin: Negative for rash.  Allergic/Immunologic: Negative.  Negative for environmental allergies, food allergies and immunocompromised state.  Neurological: Negative for headaches.  Hematological: Does not bruise/bleed easily.  Psychiatric/Behavioral: Negative for dysphoric mood. The patient is not nervous/anxious.        Objective:   Physical Exam Vitals:   03/18/17 1023  BP: 122/88  Pulse: 87  SpO2: 95%  Weight: 186 lb 12.8 oz (84.7 kg)  Height: 5\' 10"  (1.778 m)   Gen: well appearing, no acute distress HENT: NCAT, OP clear, neck supple without masses Eyes: PERRL, EOMi Lymph: no cervical lymphadenopathy PULM: CTA B CV: RRR, no mgr, no JVD GI: BS+, soft, nontender, no hsm Derm: no rash or skin breakdown MSK: pectus excavatum noted, normal bulk and tone Neuro: A&Ox4, CN II-XII intact, strength 5/5 in all 4 extremities Psyche: normal mood and affect   Chest imaging: July 2018 CT chest images independently reviewed.  Significant pectus excavatum, normal pulmonary parenchyma without evidence of emphysema or interstitial lung disease.  June 2018 echocardiogram LVEF 40-45%, flattening of the septum suggestive of RV overload  Pulmonary function test: July 2018 ratio 87%, FEV1 4.50 L 103% predicted, FVC 5.2 L 95% predicted, total lung capacity 6.7 L 97% predicted, DLCO 8.7 mL 26% predicted     Assessment & Plan:   Primary pulmonary hypertension (HCC)  Discussion: Keithon presents today for evaluation of an abnormal diffusion capacity seen on recent lung function test.  I think the first test that we need to perform to evaluate this further is simply to repeat the diffusion capacity here.  We will also check a hemoglobin value to see if he has anemia which could explain this.  The differential diagnosis of an abnormal diffusion capacity in a patient who has seemingly normal pulmonary parenchyma on a CT scan of the chest includes pulmonary hypertension, anemia.  If the  diffusion capacity today is abnormal and his hemoglobin is normal then he should have a right heart catheterization to evaluate for pulmonary hypertension prior to general anesthesia and thoracic surgery.  Bit concerned about this possibility because the echocardiogram showed evidence of RV overload.  Plan: For the abnormal pulmonary function test, low diffusion capacity: We will repeat this test here today We will check a hemoglobin value today If this value is in fact low as seen on the recent pulmonary function test then you need to have a right heart catheterization prior to surgery.  We will arrange for these tests today   Current Outpatient Prescriptions:  .  amphetamine-dextroamphetamine (ADDERALL) 20 MG tablet, Take 20 mg by mouth daily., Disp: , Rfl:  .  calcium carbonate (TUMS - DOSED IN MG ELEMENTAL CALCIUM) 500 MG chewable tablet, Chew 2 tablets by mouth 2 (two) times  daily as needed for indigestion or heartburn., Disp: , Rfl:  .  ibuprofen (ADVIL,MOTRIN) 800 MG tablet, Take 800 mg by mouth every 8 (eight) hours as needed for mild pain., Disp: , Rfl:  .  sodium-potassium bicarbonate (ALKA-SELTZER GOLD) TBEF dissolvable tablet, Take 1 tablet by mouth daily as needed (indigestion)., Disp: , Rfl:

## 2017-03-23 ENCOUNTER — Ambulatory Visit: Payer: Commercial Managed Care - PPO | Admitting: Pulmonary Disease

## 2017-03-24 ENCOUNTER — Encounter (HOSPITAL_COMMUNITY)
Admission: RE | Admit: 2017-03-24 | Discharge: 2017-03-24 | Disposition: A | Discharge status: Home / Self Care | Payer: Commercial Managed Care - PPO | Source: Ambulatory Visit | Attending: Cardiothoracic Surgery | Admitting: Cardiothoracic Surgery

## 2017-03-24 ENCOUNTER — Other Ambulatory Visit: Payer: Self-pay

## 2017-03-24 ENCOUNTER — Encounter (HOSPITAL_COMMUNITY): Payer: Self-pay

## 2017-03-24 DIAGNOSIS — Z01812 Encounter for preprocedural laboratory examination: Secondary | ICD-10-CM | POA: Insufficient documentation

## 2017-03-24 DIAGNOSIS — Q676 Pectus excavatum: Secondary | ICD-10-CM | POA: Diagnosis not present

## 2017-03-24 HISTORY — DX: Unspecified right bundle-branch block: I45.10

## 2017-03-24 HISTORY — DX: Pectus excavatum: Q67.6

## 2017-03-24 HISTORY — DX: Gastro-esophageal reflux disease without esophagitis: K21.9

## 2017-03-24 HISTORY — DX: Other specified behavioral and emotional disorders with onset usually occurring in childhood and adolescence: F98.8

## 2017-03-24 HISTORY — DX: Presence of spectacles and contact lenses: Z97.3

## 2017-03-24 LAB — COMPREHENSIVE METABOLIC PANEL
ALT: 22 U/L (ref 17–63)
AST: 16 U/L (ref 15–41)
Albumin: 4.5 g/dL (ref 3.5–5.0)
Alkaline Phosphatase: 65 U/L (ref 38–126)
Anion gap: 10 (ref 5–15)
BUN: 13 mg/dL (ref 6–20)
CO2: 21 mmol/L — ABNORMAL LOW (ref 22–32)
Calcium: 9.6 mg/dL (ref 8.9–10.3)
Chloride: 103 mmol/L (ref 101–111)
Creatinine, Ser: 1.09 mg/dL (ref 0.61–1.24)
GFR calc Af Amer: >60 mL/min (ref >60)
GFR calc non Af Amer: >60 mL/min (ref >60)
Glucose, Bld: 97 mg/dL (ref 65–99)
Potassium: 4.1 mmol/L (ref 3.5–5.1)
Sodium: 134 mmol/L — ABNORMAL LOW (ref 135–145)
Total Bilirubin: 0.8 mg/dL (ref 0.3–1.2)
Total Protein: 7.4 g/dL (ref 6.5–8.1)

## 2017-03-24 LAB — URINALYSIS, ROUTINE W REFLEX MICROSCOPIC
Bilirubin Urine: NEGATIVE
Glucose, UA: NEGATIVE mg/dL
Hgb urine dipstick: NEGATIVE
Ketones, ur: NEGATIVE mg/dL
Leukocytes, UA: NEGATIVE
Nitrite: NEGATIVE
Protein, ur: NEGATIVE mg/dL
Specific Gravity, Urine: 1.018 (ref 1.005–1.030)
pH: 7 (ref 5.0–8.0)

## 2017-03-24 LAB — CBC
HCT: 42.8 % (ref 39.0–52.0)
Hemoglobin: 15.1 g/dL (ref 13.0–17.0)
MCH: 29.2 pg (ref 26.0–34.0)
MCHC: 35.3 g/dL (ref 30.0–36.0)
MCV: 82.6 fL (ref 78.0–100.0)
Platelets: 197 10*3/uL (ref 150–400)
RBC: 5.18 MIL/uL (ref 4.22–5.81)
RDW: 12 % (ref 11.5–15.5)
WBC: 6.8 10*3/uL (ref 4.0–10.5)

## 2017-03-24 LAB — APTT: aPTT: 36 seconds (ref 24–36)

## 2017-03-24 LAB — BLOOD GAS, ARTERIAL
Acid-Base Excess: 0.5 mmol/L (ref 0.0–2.0)
Bicarbonate: 24.4 mmol/L (ref 20.0–28.0)
Drawn by: 421801
FIO2: 21
O2 Saturation: 97.5 %
Patient temperature: 98.6
pCO2 arterial: 37.5 mmHg (ref 32.0–48.0)
pH, Arterial: 7.428 (ref 7.350–7.450)
pO2, Arterial: 96.3 mmHg (ref 83.0–108.0)

## 2017-03-24 LAB — TYPE AND SCREEN
ABO/RH(D): O POS
Antibody Screen: NEGATIVE

## 2017-03-24 LAB — SURGICAL PCR SCREEN
MRSA, PCR: NEGATIVE
Staphylococcus aureus: NEGATIVE

## 2017-03-24 LAB — PROTIME-INR
INR: 1.04
Prothrombin Time: 13.5 seconds (ref 11.4–15.2)

## 2017-03-24 LAB — ABO/RH: ABO/RH(D): O POS

## 2017-03-24 NOTE — Progress Notes (Signed)
Pt denies any acute cardiopulmonary issues. Pt under the care of Dr. Purvis SheffieldKoneswaran, Cardiology. Pt denies having a cardiac cath.  Pt denies having an EKG within the last month. Pt denies recent labs. Pt chart forwarded to anesthesia for review (abnormal echo and EKG).

## 2017-03-24 NOTE — Pre-Procedure Instructions (Signed)
    Jarrett SohoMichael J Sweetland  03/24/2017      [id number] - [place], [place] - [address] [place] [id number] Phone: [phone] Fax: [phone]    Your procedure is scheduled on Monday, March 28, 2017  [facility] Ad[facility] at 5:30 A.M.  Call this number if you have problems the morning of surgery:  [phone]   Remember:  Do not eat food or drink liquids after midnight Sunday, March 27, 2017  Take these medicines the morning of surgery with A SIP OF WATER : none Stop taking Aspirin, vitamins, fish oil and herbal medications. Do not take any NSAIDs ie: Ibuprofen, Advil, Naproxen (Aleve), Motrin, BC and Goody Powder; stop now.  Do not wear jewelry, make-up or nail polish.  Do not wear lotions, powders, or perfumes, or deoderant.  Do not shave 48 hours prior to surgery.  Men may shave face and neck.  Do not bring valuables to the hospital.  [facility] is not responsible for any belongings or valuables.  Contacts, dentures or bridgework may not be worn into surgery.  Leave your suitcase in the car.  After surgery it may be brought to your room. For patients admitted to the hospital, discharge time will be determined by your treatment team. Patients discharged the day of surgery will not be allowed to drive home.  Special instructions: Shower the night before surgery and the morning of surgery with CHG. Please read over the following fact sheets that you were given. Pain Booklet, Coughing and Deep Breathing, Blood Transfusion Information, MRSA Information and Surgical Site Infection Prevention

## 2017-03-25 NOTE — Progress Notes (Signed)
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 Former smoker. BMI 26  Medications include: adderall  BP 121/82   Pulse 94   Temp 36.8 C (Oral)   Resp 18   Ht 5\' 10"  (1.778 m)   Wt 183 lb 7 oz (83.2 kg)   SpO2 100%   BMI 26.32 kg/m   Preoperative labs reviewed.    CXR will be obtained DOS  CT chest 12/21/16:  - No acute CT finding. Riki Sheer- Haller Index: 3.7  EKG 03/24/17: NSR. Possible LA enlargement. RBBB. LVH  PFTs 03/18/17 - FEV1 4.15  95% - DLCO 36.77  113% - Per Dr. Kendrick FriesMcQuaid, PFTs normal.   Exercise tolerance test 12/20/16:   Blood pressure demonstrated a normal response to exercise.  There was no ST segment deviation noted during stress. Negative ekg stress test for ischemia  [facility] treadmill score of 12 consistent with low risk for major cardiac events.  Echo 11/10/16:  - Left ventricle: The cavity size was normal. Wall thickness was normal. Systolic function was mildly to moderately reduced. The estimated ejection fraction was in the range of 40% to 45%. Diffuse hypokinesis. The study is not technically sufficient to allow evaluation of LV diastolic function. There was no evidence of elevated ventricular filling pressure by Doppler parameters. - Aortic valve: Valve area (VTI): 3.85 cm^2. Valve area (Vmax): 3.12 cm^2. Valve area (Vmean): 3.22 cm^2. - Right ventricle: The ventricular septum is flattened in systole, suggesting RV pressure overload.  If no changes, I anticipate pt can proceed with surgery as scheduled.   Rica Mastngela Kia Stavros, FNP-BC [facility] Phone: [phone] 03/25/2017 10:41 AM

## 2017-03-27 NOTE — Anesthesia Preprocedure Evaluation (Addendum)
Anesthesia Evaluation  Patient identified by MRN, date of birth, ID band Patient awake    Reviewed: Allergy & Precautions, H&P , NPO status , Patient's Chart, lab work & pertinent test results  Airway Mallampati: II  TM Distance: >3 FB Neck ROM: Full    Dental no notable dental hx. (+) Teeth Intact, Dental Advisory Given   Pulmonary neg pulmonary ROS, former smoker,    Pulmonary exam normal breath sounds clear to auscultation       Cardiovascular Exercise Tolerance: Good + dysrhythmias  Rhythm:Regular Rate:Normal     Neuro/Psych negative neurological ROS  negative psych ROS   GI/Hepatic Neg liver ROS, GERD  Medicated and Controlled,  Endo/Other  negative endocrine ROS  Renal/GU negative Renal ROS  negative genitourinary   Musculoskeletal   Abdominal   Peds  Hematology negative hematology ROS (+)   Anesthesia Other Findings   Reproductive/Obstetrics negative OB ROS                            Anesthesia Physical Anesthesia Plan  ASA: II  Anesthesia Plan: General   Post-op Pain Management:  Regional for Post-op pain   Induction: Intravenous  PONV Risk Score and Plan: 3 and Ondansetron, Dexamethasone and Midazolam  Airway Management Planned: Oral ETT  Additional Equipment: Arterial line  Intra-op Plan:   Post-operative Plan: Extubation in OR  Informed Consent: I have reviewed the patients History and Physical, chart, labs and discussed the procedure including the risks, benefits and alternatives for the proposed anesthesia with the patient or authorized representative who has indicated his/her understanding and acceptance.   Dental advisory given  Plan Discussed with: CRNA  Anesthesia Plan Comments:        Anesthesia Quick Evaluation

## 2017-03-28 ENCOUNTER — Inpatient Hospital Stay (HOSPITAL_COMMUNITY)
Admission: RE | Admit: 2017-03-28 | Discharge: 2017-04-02 | DRG: 516 | Disposition: A | Discharge status: Home / Self Care | Payer: Commercial Managed Care - PPO | Attending: Cardiothoracic Surgery | Admitting: Cardiothoracic Surgery

## 2017-03-28 ENCOUNTER — Encounter (HOSPITAL_COMMUNITY)
Admission: RE | Disposition: A | Discharge status: Home / Self Care | Payer: Self-pay | Source: Home / Self Care | Attending: Cardiothoracic Surgery

## 2017-03-28 ENCOUNTER — Inpatient Hospital Stay (HOSPITAL_COMMUNITY): Payer: Commercial Managed Care - PPO

## 2017-03-28 ENCOUNTER — Inpatient Hospital Stay (HOSPITAL_COMMUNITY): Payer: Commercial Managed Care - PPO | Admitting: Anesthesiology

## 2017-03-28 ENCOUNTER — Inpatient Hospital Stay (HOSPITAL_COMMUNITY): Payer: Commercial Managed Care - PPO | Admitting: Emergency Medicine

## 2017-03-28 ENCOUNTER — Encounter (HOSPITAL_COMMUNITY): Payer: Self-pay | Admitting: *Deleted

## 2017-03-28 DIAGNOSIS — R002 Palpitations: Secondary | ICD-10-CM | POA: Diagnosis present

## 2017-03-28 DIAGNOSIS — Z87891 Personal history of nicotine dependence: Secondary | ICD-10-CM

## 2017-03-28 DIAGNOSIS — F988 Other specified behavioral and emotional disorders with onset usually occurring in childhood and adolescence: Secondary | ICD-10-CM | POA: Diagnosis present

## 2017-03-28 DIAGNOSIS — D62 Acute posthemorrhagic anemia: Secondary | ICD-10-CM | POA: Diagnosis not present

## 2017-03-28 DIAGNOSIS — R0602 Shortness of breath: Secondary | ICD-10-CM | POA: Diagnosis present

## 2017-03-28 DIAGNOSIS — J939 Pneumothorax, unspecified: Secondary | ICD-10-CM

## 2017-03-28 DIAGNOSIS — K219 Gastro-esophageal reflux disease without esophagitis: Secondary | ICD-10-CM | POA: Diagnosis present

## 2017-03-28 DIAGNOSIS — M954 Acquired deformity of chest and rib: Secondary | ICD-10-CM

## 2017-03-28 DIAGNOSIS — J9811 Atelectasis: Secondary | ICD-10-CM | POA: Diagnosis present

## 2017-03-28 DIAGNOSIS — I4510 Unspecified right bundle-branch block: Secondary | ICD-10-CM | POA: Diagnosis present

## 2017-03-28 DIAGNOSIS — Z880 Allergy status to penicillin: Secondary | ICD-10-CM | POA: Diagnosis not present

## 2017-03-28 DIAGNOSIS — Z91030 Bee allergy status: Secondary | ICD-10-CM | POA: Diagnosis not present

## 2017-03-28 DIAGNOSIS — Q676 Pectus excavatum: Secondary | ICD-10-CM | POA: Diagnosis not present

## 2017-03-28 DIAGNOSIS — Z9689 Presence of other specified functional implants: Secondary | ICD-10-CM

## 2017-03-28 DIAGNOSIS — Z01818 Encounter for other preprocedural examination: Secondary | ICD-10-CM

## 2017-03-28 HISTORY — PX: RIB PLATING: SHX5079

## 2017-03-28 HISTORY — PX: PECTUS EXCAVATUM REPAIR: SHX437

## 2017-03-28 LAB — GLUCOSE, CAPILLARY
Glucose-Capillary: 130 mg/dL — ABNORMAL HIGH (ref 65–99)
Glucose-Capillary: 137 mg/dL — ABNORMAL HIGH (ref 65–99)
Glucose-Capillary: 142 mg/dL — ABNORMAL HIGH (ref 65–99)
Glucose-Capillary: 96 mg/dL (ref 65–99)

## 2017-03-28 SURGERY — RECONSTRUCTION, CHEST WALL, FOR PECTUS EXCAVATUM
Anesthesia: General | Site: Chest

## 2017-03-28 MED ORDER — ONDANSETRON HCL 4 MG/2ML IJ SOLN
4.0000 mg | Freq: Four times a day (QID) | INTRAMUSCULAR | Status: DC | PRN
  Administered 2017-03-30 – 2017-03-31 (×2): 4 mg via INTRAVENOUS
  Filled 2017-03-28 (×2): qty 2

## 2017-03-28 MED ORDER — INSULIN ASPART 100 UNIT/ML ~~LOC~~ SOLN
0.0000 [IU] | SUBCUTANEOUS | Status: DC
  Administered 2017-03-28 – 2017-03-29 (×4): 2 [IU] via SUBCUTANEOUS

## 2017-03-28 MED ORDER — HYDROMORPHONE HCL 1 MG/ML IJ SOLN: 0.2500 mg | INTRAMUSCULAR | Status: DC | PRN

## 2017-03-28 MED ORDER — VANCOMYCIN HCL IN DEXTROSE 1-5 GM/200ML-% IV SOLN
INTRAVENOUS | Status: AC
  Filled 2017-03-28: qty 200

## 2017-03-28 MED ORDER — GABAPENTIN 300 MG PO CAPS
ORAL_CAPSULE | ORAL | Status: AC
Start: 2017-03-28 — End: 2017-03-28
  Administered 2017-03-28: 600 mg via ORAL
  Filled 2017-03-28: qty 2

## 2017-03-28 MED ORDER — DEXAMETHASONE SODIUM PHOSPHATE 10 MG/ML IJ SOLN
INTRAMUSCULAR | Status: DC | PRN
  Administered 2017-03-28: 10 mg via INTRAVENOUS

## 2017-03-28 MED ORDER — SENNOSIDES-DOCUSATE SODIUM 8.6-50 MG PO TABS
1.0000 | ORAL_TABLET | Freq: Every day | ORAL | Status: DC
  Administered 2017-03-29 – 2017-03-30 (×2): 1 via ORAL
  Filled 2017-03-28 (×4): qty 1

## 2017-03-28 MED ORDER — SODIUM CHLORIDE 0.9 % IV SOLN
0.1000 mg/kg/h | INTRAVENOUS | Status: DC
  Filled 2017-03-28: qty 2

## 2017-03-28 MED ORDER — PROPOFOL 10 MG/ML IV BOLUS
INTRAVENOUS | Status: DC | PRN
  Administered 2017-03-28: 50 mg via INTRAVENOUS
  Administered 2017-03-28: 150 mg via INTRAVENOUS

## 2017-03-28 MED ORDER — LACTATED RINGERS IV SOLN
INTRAVENOUS | Status: DC | PRN
  Administered 2017-03-28 (×3): via INTRAVENOUS

## 2017-03-28 MED ORDER — HYDRALAZINE HCL 20 MG/ML IJ SOLN
10.0000 mg | INTRAMUSCULAR | Status: DC | PRN
  Administered 2017-03-28: 10 mg via INTRAVENOUS
  Filled 2017-03-28: qty 1

## 2017-03-28 MED ORDER — ROCURONIUM BROMIDE 100 MG/10ML IV SOLN
INTRAVENOUS | Status: DC | PRN
  Administered 2017-03-28: 30 mg via INTRAVENOUS
  Administered 2017-03-28: 20 mg via INTRAVENOUS
  Administered 2017-03-28: 80 mg via INTRAVENOUS
  Administered 2017-03-28: 50 mg via INTRAVENOUS
  Administered 2017-03-28: 20 mg via INTRAVENOUS

## 2017-03-28 MED ORDER — POTASSIUM CHLORIDE 10 MEQ/50ML IV SOLN
10.0000 meq | Freq: Every day | INTRAVENOUS | Status: DC | PRN
  Filled 2017-03-28: qty 50

## 2017-03-28 MED ORDER — SODIUM CHLORIDE 0.9% FLUSH: 9.0000 mL | INTRAVENOUS | Status: DC | PRN

## 2017-03-28 MED ORDER — VANCOMYCIN HCL IN DEXTROSE 1-5 GM/200ML-% IV SOLN
1000.0000 mg | INTRAVENOUS | Status: AC
  Administered 2017-03-28: 1000 mg via INTRAVENOUS

## 2017-03-28 MED ORDER — ACETAMINOPHEN 500 MG PO TABS
1000.0000 mg | ORAL_TABLET | Freq: Four times a day (QID) | ORAL | Status: DC
  Administered 2017-03-28 – 2017-04-02 (×19): 1000 mg via ORAL
  Filled 2017-03-28 (×20): qty 2

## 2017-03-28 MED ORDER — ORAL CARE MOUTH RINSE
15.0000 mL | Freq: Two times a day (BID) | OROMUCOSAL | Status: DC
  Administered 2017-03-28 – 2017-03-30 (×3): 15 mL via OROMUCOSAL

## 2017-03-28 MED ORDER — PROPOFOL 10 MG/ML IV BOLUS
INTRAVENOUS | Status: AC
  Filled 2017-03-28: qty 20

## 2017-03-28 MED ORDER — FENTANYL CITRATE (PF) 250 MCG/5ML IJ SOLN
INTRAMUSCULAR | Status: AC
  Filled 2017-03-28: qty 5

## 2017-03-28 MED ORDER — DEXMEDETOMIDINE HCL 200 MCG/2ML IV SOLN
INTRAVENOUS | Status: DC | PRN
  Administered 2017-03-28 (×3): 8 ug via INTRAVENOUS

## 2017-03-28 MED ORDER — HEMOSTATIC AGENTS (NO CHARGE) OPTIME
TOPICAL | Status: DC | PRN
  Administered 2017-03-28 (×2): 1 via TOPICAL

## 2017-03-28 MED ORDER — BISACODYL 5 MG PO TBEC
10.0000 mg | DELAYED_RELEASE_TABLET | Freq: Every day | ORAL | Status: DC
  Administered 2017-03-29 – 2017-03-31 (×3): 10 mg via ORAL
  Filled 2017-03-28 (×5): qty 2

## 2017-03-28 MED ORDER — ACETAMINOPHEN 500 MG PO TABS
1000.0000 mg | ORAL_TABLET | Freq: Once | ORAL | Status: AC
  Administered 2017-03-28: 1000 mg via ORAL
  Filled 2017-03-28: qty 2

## 2017-03-28 MED ORDER — KETAMINE HCL-SODIUM CHLORIDE 100-0.9 MG/10ML-% IV SOSY
PREFILLED_SYRINGE | INTRAVENOUS | Status: AC
  Filled 2017-03-28: qty 10

## 2017-03-28 MED ORDER — MIDAZOLAM HCL 2 MG/2ML IJ SOLN
INTRAMUSCULAR | Status: AC
  Filled 2017-03-28: qty 2

## 2017-03-28 MED ORDER — NALOXONE HCL 0.4 MG/ML IJ SOLN: 0.4000 mg | INTRAMUSCULAR | Status: DC | PRN

## 2017-03-28 MED ORDER — ONDANSETRON HCL 4 MG/2ML IJ SOLN
INTRAMUSCULAR | Status: DC | PRN
  Administered 2017-03-28: 4 mg via INTRAVENOUS

## 2017-03-28 MED ORDER — GABAPENTIN 300 MG PO CAPS
600.0000 mg | ORAL_CAPSULE | Freq: Once | ORAL | Status: AC
  Administered 2017-03-28: 600 mg via ORAL
  Filled 2017-03-28: qty 2

## 2017-03-28 MED ORDER — HEMOSTATIC AGENTS (NO CHARGE) OPTIME
TOPICAL | Status: DC | PRN
  Administered 2017-03-28: 1 via TOPICAL

## 2017-03-28 MED ORDER — LIDOCAINE 2% (20 MG/ML) 5 ML SYRINGE
INTRAMUSCULAR | Status: AC
  Filled 2017-03-28: qty 5

## 2017-03-28 MED ORDER — SUGAMMADEX SODIUM 200 MG/2ML IV SOLN
INTRAVENOUS | Status: DC | PRN
  Administered 2017-03-28: 170 mg via INTRAVENOUS

## 2017-03-28 MED ORDER — FENTANYL 40 MCG/ML IV SOLN
INTRAVENOUS | Status: DC
  Administered 2017-03-28: 135 ug via INTRAVENOUS
  Administered 2017-03-28: 1000 ug via INTRAVENOUS
  Administered 2017-03-29: 150 ug via INTRAVENOUS
  Administered 2017-03-29: 75 ug via INTRAVENOUS
  Administered 2017-03-29: 165 ug via INTRAVENOUS
  Administered 2017-03-29: 90 ug via INTRAVENOUS
  Administered 2017-03-29: 45 ug via INTRAVENOUS
  Administered 2017-03-29: 180 ug via INTRAVENOUS
  Administered 2017-03-30: 15 ug via INTRAVENOUS
  Administered 2017-03-30: 60 ug via INTRAVENOUS
  Administered 2017-03-30: 255 ug via INTRAVENOUS
  Administered 2017-03-30: 30 ug via INTRAVENOUS
  Administered 2017-03-30: 75 ug via INTRAVENOUS
  Administered 2017-03-30: 83.05 ug via INTRAVENOUS
  Administered 2017-03-31: 30 ug via INTRAVENOUS
  Administered 2017-03-31: 45 ug via INTRAVENOUS
  Filled 2017-03-28 (×3): qty 25

## 2017-03-28 MED ORDER — SODIUM CHLORIDE 0.9 % IV SOLN
INTRAVENOUS | Status: DC | PRN
  Administered 2017-03-28: 11:00:00 via INTRAVENOUS
  Administered 2017-03-28: 10 ug/kg/min via INTRAVENOUS

## 2017-03-28 MED ORDER — 0.9 % SODIUM CHLORIDE (POUR BTL) OPTIME
TOPICAL | Status: DC | PRN
  Administered 2017-03-28: 1000 mL

## 2017-03-28 MED ORDER — AMPHETAMINE-DEXTROAMPHETAMINE 10 MG PO TABS
20.0000 mg | ORAL_TABLET | Freq: Every day | ORAL | Status: DC
  Filled 2017-03-28 (×3): qty 2

## 2017-03-28 MED ORDER — KETAMINE HCL 10 MG/ML IJ SOLN
INTRAMUSCULAR | Status: DC | PRN
  Administered 2017-03-28: 40 mg via INTRAVENOUS

## 2017-03-28 MED ORDER — TRAMADOL HCL 50 MG PO TABS
50.0000 mg | ORAL_TABLET | Freq: Four times a day (QID) | ORAL | Status: DC | PRN
  Administered 2017-03-30: 50 mg via ORAL
  Administered 2017-03-31 – 2017-04-02 (×8): 100 mg via ORAL
  Filled 2017-03-28: qty 2
  Filled 2017-03-28: qty 1
  Filled 2017-03-28 (×7): qty 2

## 2017-03-28 MED ORDER — DEXTROSE-NACL 5-0.45 % IV SOLN
INTRAVENOUS | Status: DC
  Administered 2017-03-28 – 2017-03-30 (×3): via INTRAVENOUS

## 2017-03-28 MED ORDER — ACETAMINOPHEN 160 MG/5ML PO SOLN
1000.0000 mg | Freq: Four times a day (QID) | ORAL | Status: DC
  Filled 2017-03-28: qty 40.6

## 2017-03-28 MED ORDER — VANCOMYCIN HCL IN DEXTROSE 1-5 GM/200ML-% IV SOLN
1000.0000 mg | Freq: Two times a day (BID) | INTRAVENOUS | Status: AC
  Administered 2017-03-28: 1000 mg via INTRAVENOUS
  Filled 2017-03-28: qty 200

## 2017-03-28 MED ORDER — FENTANYL CITRATE (PF) 100 MCG/2ML IJ SOLN
INTRAMUSCULAR | Status: DC | PRN
  Administered 2017-03-28: 50 ug via INTRAVENOUS
  Administered 2017-03-28 (×3): 100 ug via INTRAVENOUS
  Administered 2017-03-28 (×2): 50 ug via INTRAVENOUS
  Administered 2017-03-28 (×2): 100 ug via INTRAVENOUS

## 2017-03-28 MED ORDER — SODIUM CHLORIDE 0.9 % IJ SOLN
INTRAMUSCULAR | Status: DC | PRN
  Administered 2017-03-28 (×2): via TOPICAL

## 2017-03-28 MED ORDER — DIPHENHYDRAMINE HCL 50 MG/ML IJ SOLN: 12.5000 mg | Freq: Four times a day (QID) | INTRAMUSCULAR | Status: DC | PRN

## 2017-03-28 MED ORDER — KETAMINE HCL 100 MG/ML IJ SOLN
0.1000 mg/kg/h | Freq: Once | INTRAMUSCULAR | Status: DC
  Filled 2017-03-28: qty 2

## 2017-03-28 MED ORDER — ACETAMINOPHEN 500 MG PO TABS
ORAL_TABLET | ORAL | Status: AC
  Administered 2017-03-28: 1000 mg via ORAL
  Filled 2017-03-28: qty 2

## 2017-03-28 MED ORDER — MIDAZOLAM HCL 5 MG/5ML IJ SOLN
INTRAMUSCULAR | Status: DC | PRN
  Administered 2017-03-28 (×2): 2 mg via INTRAVENOUS

## 2017-03-28 MED ORDER — BUPIVACAINE-EPINEPHRINE (PF) 0.5% -1:200000 IJ SOLN
INTRAMUSCULAR | Status: DC | PRN
  Administered 2017-03-28: 40 mL

## 2017-03-28 MED ORDER — OXYCODONE HCL 5 MG PO TABS
5.0000 mg | ORAL_TABLET | ORAL | Status: DC | PRN
  Administered 2017-03-29 – 2017-03-30 (×9): 10 mg via ORAL
  Administered 2017-03-31: 5 mg via ORAL
  Administered 2017-03-31: 10 mg via ORAL
  Administered 2017-03-31: 5 mg via ORAL
  Administered 2017-03-31 – 2017-04-01 (×2): 10 mg via ORAL
  Administered 2017-04-01: 5 mg via ORAL
  Administered 2017-04-01: 10 mg via ORAL
  Administered 2017-04-01: 5 mg via ORAL
  Administered 2017-04-01 – 2017-04-02 (×2): 10 mg via ORAL
  Administered 2017-04-02: 5 mg via ORAL
  Filled 2017-03-28 (×2): qty 2
  Filled 2017-03-28: qty 1
  Filled 2017-03-28 (×13): qty 2
  Filled 2017-03-28: qty 1
  Filled 2017-03-28: qty 2
  Filled 2017-03-28: qty 1
  Filled 2017-03-28: qty 2

## 2017-03-28 MED ORDER — DIPHENHYDRAMINE HCL 12.5 MG/5ML PO ELIX
12.5000 mg | ORAL_SOLUTION | Freq: Four times a day (QID) | ORAL | Status: DC | PRN
  Filled 2017-03-28: qty 5

## 2017-03-28 MED ORDER — ARTIFICIAL TEARS OPHTHALMIC OINT
TOPICAL_OINTMENT | OPHTHALMIC | Status: DC | PRN
  Administered 2017-03-28: 1 via OPHTHALMIC

## 2017-03-28 MED ORDER — FENTANYL CITRATE (PF) 100 MCG/2ML IJ SOLN: 25.0000 ug | INTRAMUSCULAR | Status: DC | PRN

## 2017-03-28 MED ORDER — ROCURONIUM BROMIDE 10 MG/ML (PF) SYRINGE
PREFILLED_SYRINGE | INTRAVENOUS | Status: AC
  Filled 2017-03-28: qty 5

## 2017-03-28 SURGICAL SUPPLY — 77 items
BATTERY MAXDRIVER (MISCELLANEOUS) ×3 IMPLANT
BLADE AVERAGE 25X9 (BLADE) IMPLANT
BLADE CORE FAN STRYKER (BLADE) ×3 IMPLANT
BLADE STERNUM SYSTEM 6 (BLADE) ×3 IMPLANT
BLADE SURG 11 STRL SS (BLADE) ×3 IMPLANT
BLADE SURG 15 STRL LF DISP TIS (BLADE) ×2 IMPLANT
BLADE SURG 15 STRL SS (BLADE) ×1
CANISTER SUCT 3000ML PPV (MISCELLANEOUS) ×3 IMPLANT
CATH FOLEY 2WAY SLVR  5CC 12FR (CATHETERS)
CATH FOLEY 2WAY SLVR  5CC 14FR (CATHETERS)
CATH FOLEY 2WAY SLVR 5CC 12FR (CATHETERS) IMPLANT
CATH FOLEY 2WAY SLVR 5CC 14FR (CATHETERS) IMPLANT
CATH HYDRAGLIDE XL THORACIC (CATHETERS) IMPLANT
CATH THORACIC 20FR (CATHETERS) IMPLANT
CATH THORACIC 28FR (CATHETERS) IMPLANT
CATH TROCAR 20FR (CATHETERS) IMPLANT
CLIP VESOCCLUDE MED 24/CT (CLIP) ×3 IMPLANT
CLIP VESOCCLUDE SM WIDE 24/CT (CLIP) ×3 IMPLANT
CONNECTOR 5 IN 1 STRAIGHT STRL (MISCELLANEOUS) IMPLANT
DERMABOND ADVANCED (GAUZE/BANDAGES/DRESSINGS)
DERMABOND ADVANCED .7 DNX12 (GAUZE/BANDAGES/DRESSINGS) IMPLANT
DRAIN HEMOVAC 1/8 X 5 (WOUND CARE) ×3 IMPLANT
DRAIN SNY 10 ROU (WOUND CARE) IMPLANT
DRAIN WOUND SNY 15 RND (WOUND CARE) IMPLANT
DRAPE LAPAROSCOPIC ABDOMINAL (DRAPES) ×3 IMPLANT
DRAPE WARM FLUID 44X44 (DRAPE) ×3 IMPLANT
DRSG AQUACEL AG ADV 3.5X14 (GAUZE/BANDAGES/DRESSINGS) ×3 IMPLANT
ELECT NEEDLE TIP 2.8 STRL (NEEDLE) ×3 IMPLANT
ELECT REM PT RETURN 9FT ADLT (ELECTROSURGICAL) ×3
ELECTRODE REM PT RTRN 9FT ADLT (ELECTROSURGICAL) ×2 IMPLANT
EVACUATOR 1/8 PVC DRAIN (DRAIN) IMPLANT
EVACUATOR SILICONE 100CC (DRAIN) ×6 IMPLANT
GAUZE SPONGE 4X4 12PLY STRL (GAUZE/BANDAGES/DRESSINGS) ×3 IMPLANT
GAUZE SPONGE 4X4 12PLY STRL LF (GAUZE/BANDAGES/DRESSINGS) ×3 IMPLANT
GEL ULTRASOUND 20GR AQUASONIC (MISCELLANEOUS) IMPLANT
GLOVE BIO SURGEON STRL SZ 6.5 (GLOVE) ×3 IMPLANT
GLOVE BIOGEL PI IND STRL 6.5 (GLOVE) ×4 IMPLANT
GLOVE BIOGEL PI INDICATOR 6.5 (GLOVE) ×2
GLOVE SURG SIGNA 7.5 PF LTX (GLOVE) ×3 IMPLANT
GLOVE SURG SS PI 6.0 STRL IVOR (GLOVE) ×3 IMPLANT
GOWN STRL REUS W/ TWL LRG LVL3 (GOWN DISPOSABLE) ×4 IMPLANT
GOWN STRL REUS W/TWL LRG LVL3 (GOWN DISPOSABLE) ×2
HEMOSTAT POWDER SURGIFOAM 1G (HEMOSTASIS) ×6 IMPLANT
HEMOSTAT SURGICEL 2X14 (HEMOSTASIS) ×3 IMPLANT
KIT BASIN OR (CUSTOM PROCEDURE TRAY) ×3 IMPLANT
KIT ROOM TURNOVER OR (KITS) ×3 IMPLANT
LIGHT WAVEGUIDE WIDE FLAT (MISCELLANEOUS) ×3 IMPLANT
NS IRRIG 1000ML POUR BTL (IV SOLUTION) ×6 IMPLANT
PACK CHEST (CUSTOM PROCEDURE TRAY) ×3 IMPLANT
PAD ARMBOARD 7.5X6 YLW CONV (MISCELLANEOUS) ×6 IMPLANT
PLATE STERNAL LOCK TI 2MMX20 (Plate) ×6 IMPLANT
PLATE Y SHAPE 40 X HOLE 2.3MM (Plate) ×3 IMPLANT
SCREW BONE LOCKING 2.3X9 (Screw) ×18 IMPLANT
SCREW LOCKING TI 2.3X11MM (Screw) ×57 IMPLANT
SCREW RIB MAXDRIVE 2.3X7 (Screw) ×2 IMPLANT
SCREW RIB MAXDRIVE 2.3X7 LV1 (Screw) ×6 IMPLANT
SCREW RIB MAXDRIVE 2.3X7MM (Screw) ×4 IMPLANT
SCREWDRIVER BATTERY POWERED (MISCELLANEOUS) ×3 IMPLANT
SUCTION HEMOVAC SNY HYST (SUCTIONS) IMPLANT
SUT PROLENE 0 CT 1 CR/8 (SUTURE) IMPLANT
SUT PROLENE 2 0 CT 1 CR (SUTURE) IMPLANT
SUT SILK 0 FSL (SUTURE) IMPLANT
SUT STEEL 5 V 56 M (SUTURE) IMPLANT
SUT STEEL 6MS V (SUTURE) IMPLANT
SUT VIC AB 0 CT1 18XCR BRD 8 (SUTURE) ×10 IMPLANT
SUT VIC AB 0 CT1 8-18 (SUTURE) ×5
SUT VIC AB 1 CTX 18 (SUTURE) IMPLANT
SUT VIC AB 2-0 CT1 18 (SUTURE) ×6 IMPLANT
SUT VIC AB 2-0 CTX 36 (SUTURE) IMPLANT
SUT VIC AB 3-0 SH 27 (SUTURE) ×1
SUT VIC AB 3-0 SH 27X BRD (SUTURE) ×2 IMPLANT
SUT VIC AB 3-0 X1 27 (SUTURE) ×3 IMPLANT
SYSTEM SAHARA CHEST DRAIN RE-I (WOUND CARE) IMPLANT
TOWEL OR 17X26 10 PK STRL BLUE (TOWEL DISPOSABLE) ×3 IMPLANT
TRAY FOLEY CATH SILVER 16FR (SET/KITS/TRAYS/PACK) ×3 IMPLANT
WATER STERILE IRR 1000ML POUR (IV SOLUTION) ×6 IMPLANT
YANKAUER SUCT BULB TIP NO VENT (SUCTIONS) ×3 IMPLANT

## 2017-03-28 NOTE — Anesthesia Procedure Notes (Signed)
Anesthesia Regional Block: Pectoralis block   Pre-Anesthetic Checklist: ,, timeout performed, Correct Patient, Correct Site, Correct Laterality, Correct Procedure, Correct Position, site marked, Risks and benefits discussed, pre-op evaluation,  At surgeon's request and post-op pain management  Laterality: Left and Right  Prep: Maximum Sterile Barrier Precautions used, chloraprep       Needles:  Injection technique: Single-shot  Needle Type: Echogenic Stimulator Needle     Needle Length: 9cm  Needle Gauge: 21     Additional Needles:   Procedures:,,,, ultrasound used (permanent image in chart),,,,  Narrative:  Start time: 03/28/2017 7:00 AM End time: 03/28/2017 7:20 AM Injection made incrementally with aspirations every 5 mL. Anesthesiologist: Gaynelle AduFitzgerald, Sahiti Joswick, MD  Additional Notes: 2% Lidocaine skin wheel. Bilateral PECS block.

## 2017-03-28 NOTE — Interval H&P Note (Signed)
History and Physical Interval Note:  03/28/2017 7:08 AM  Marcus Ellis  has presented today for surgery, with the diagnosis of PECTUS EXCAVATUM  The various methods of treatment have been discussed with the patient and family. After consideration of risks, benefits and other options for treatment, the patient has consented to  Procedure(s): PECTUS EXCAVATUM RECONSTRUCTION (N/A) as a surgical intervention .  The patient's history has been reviewed, patient examined, no change in status, stable for surgery.  I have reviewed the patient's chart and labs.  Questions were answered to the patient's satisfaction.     Delight OvensEdward B Honestie Kulik

## 2017-03-28 NOTE — Brief Op Note (Addendum)
      [address]       [place], [id number]             [phone]      03/28/2017  2:29 PM  PATIENT:  Marcus Ellis  36 y.o. male  PRE-OPERATIVE DIAGNOSIS:  PECTUS EXCAVATUM  POST-OPERATIVE DIAGNOSIS:  PECTUS EXCAVATUM  PROCEDURE:  Procedure(s): REPAIR OF PECTUS EXCAVATUM WITH MODIFIED RAVITCH PROCEDURE (N/A) STERNAL PLATING  SURGEON:  Surgeon(s) and Role:    * Delight OvensGerhardt, Marquarius Lofton B, MD - Primary    * Loreli SlotHendrickson, Steven C, MD - Assisting  PHYSICIAN ASSISTANT:  Jari Favreessa Conte, PA-C   ANESTHESIA:   general  EBL:  500 mL   BLOOD ADMINISTERED:none  DRAINS: ROUTINE   LOCAL MEDICATIONS USED:  NONE  SPECIMEN:  Source of Specimen:  RIGHT AND LEFT RIB FRAGMENTS  DISPOSITION OF SPECIMEN:  PATHOLOGY  COUNTS:  YES   DICTATION: .Dragon Dictation  PLAN OF CARE: Admit to inpatient   PATIENT DISPOSITION:  ICU - intubated and hemodynamically stable.   Delay start of Pharmacological VTE agent (>24hrs) due to surgical blood loss or risk of bleeding: yes

## 2017-03-28 NOTE — Anesthesia Procedure Notes (Signed)
Arterial Line Insertion Start/End11/09/2016 6:45 AM, 03/28/2017 6:55 AM Performed by: Rosiland OzMeyers, Raymar Joiner, CRNA, CRNA  Patient location: Pre-op. Preanesthetic checklist: patient identified, IV checked, site marked, risks and benefits discussed, surgical consent, monitors and equipment checked, pre-op evaluation, timeout performed and anesthesia consent Lidocaine 1% used for infiltration Right, radial was placed Catheter size: 20 G Hand hygiene performed  and maximum sterile barriers used   Attempts: 1 Procedure performed without using ultrasound guided technique. Following insertion, Biopatch and dressing applied. Post procedure assessment: normal  Patient tolerated the procedure well with no immediate complications.

## 2017-03-28 NOTE — Transfer of Care (Signed)
Immediate Anesthesia Transfer of Care Note  Patient: Marcus SohoMichael J Ellis  Procedure(s) Performed: REPAIR OF PECTUS EXCAVATUM WITH MODIFIED RAVITCH PROCEDURE (N/A Chest) STERNAL PLATING  Patient Location: PACU  Anesthesia Type:General  Level of Consciousness: sedated and patient cooperative  Airway & Oxygen Therapy: Patient Spontanous Breathing and Patient connected to face mask oxygen  Post-op Assessment: Report given to RN and Post -op Vital signs reviewed and stable  Post vital signs: Reviewed and stable  Last Vitals:  Vitals:   03/28/17 0555 03/28/17 1410  BP: (!) 140/95 130/72  Pulse: 82 99  Resp: 18 14  Temp: 36.4 C 36.6 C  SpO2: 100% 99%    Last Pain:  Vitals:   03/28/17 0555  TempSrc: Oral      Patients Stated Pain Goal: 2 (03/28/17 0558)  Complications: No apparent anesthesia complications

## 2017-03-28 NOTE — H&P (View-Only) (Signed)
[address]       [place] [address]             [phone]                    Marcus Ellis [facility] Medical Record #[id number] Date of Birth: [birth year]  Referring: No ref. provider found Primary Care: Selinda Flavin, MD  Chief Complaint:    No chief complaint on file.   History of Present Illness:    Marcus Ellis 36 y.o. male  Has been followed in the office for consideration of pectus repair . He was originally seen for evaluation of recent history of palpitations and exertional dyspnea. The patient has a history of known pectus excavatum and has been  evaluated multiple times in the past including in 2008 well he was in the [employer]. He notes his pulmonary function studies were normal at that time , He provided copies today. The patient works as a Engineer, civil (consulting) . EKG on himself and noticed a right bundle branch block, he did this because he noted palpitations and increasing shortness of breath with exertion. Cardiology evaluation was performed including echocardiogram which showed a decreased ejection fraction 40-50%. CT scan of the chest was performed demonstrating a Haller index of 3.7. Stress test was negative for ischemia. PFTs were done demonstrating normal volumes and significantly decreased diffusion capacity. The patient is referred for consideration of pectus repair.   Cardiopulmonary stress test has been preformed.  The patient is a previous smoker, up to a pack a day for 3-5 years quit in 2006. Growing up visit child he denies any asthmatic lung disease. Participated in sports including basketball wrestling and was always able to keep up with his peers.    Current Activity/ Functional Status:  Patient is independent with mobility/ambulation, transfers, ADL's, IADL's.   Zubrod Score: At the time of surgery this patient's most appropriate activity status/level should be described as: [x]     0    Normal activity, no symptoms []     1     Restricted in physical strenuous activity but ambulatory, able to do out light work []     2    Ambulatory and capable of self care, unable to do work activities, up and about               >50 % of waking hours                              []     3    Only limited self care, in bed greater than 50% of waking hours []     4    Completely disabled, no self care, confined to bed or chair []     5    Moribund   Past Medical History:  Diagnosis Date  . ADD (attention deficit disorder)   . GERD (gastroesophageal reflux disease)   . Palpitations   . Pectus excavatum   . RBBB (right bundle branch block)   . Wears contact lenses   . Wears glasses     Past Surgical History:  Procedure Laterality Date  . NOSE SURGERY      Family History  Problem Relation Age of Onset  . High blood pressure Mother   . High blood pressure Father     Social History   Social History  . Marital status: Married    Spouse  name: N/A  . Number of children: N/A  . Years of education: N/A   Occupational History  . Not on file.   Social History Main Topics  . Smoking status: Smoked 3-5 years quit 2006   . Smokeless tobacco: Current User  . Alcohol use Not on file  . Drug use: Unknown  . Sexual activity: Not on file   Other Topics Concern  . Not on file   Social History Narrative  . No narrative on file    Social History   Tobacco Use  Smoking Status Former Smoker  . Packs/day: 1.00  . Years: 4.00  . Pack years: 4.00  Smokeless Tobacco Current User  . Types: Chew  Tobacco Comment   quit smoking in 2006    Social History   Substance and Sexual Activity  Alcohol Use Yes   Comment: occ     Allergies  Allergen Reactions  . Bee Venom Hives  . Penicillins Other (See Comments)    UNSPECIFIED REACTION AS A CHILD  Has patient had a PCN reaction causing immediate rash, facial/tongue/throat swelling, SOB or lightheadedness with hypotension: Unknown Has patient had a PCN reaction causing  severe rash involving mucus membranes or skin necrosis: Unknown Has patient had a PCN reaction that required hospitalization: Unknown Has patient had a PCN reaction occurring within the last 10 years: No If all of the above answers are "NO", then may proceed with Cephalosporin use.     Current Facility-Administered Medications  Medication Dose Route Frequency Provider Last Rate Last Dose  . acetaminophen (TYLENOL) 500 MG tablet           . acetaminophen (TYLENOL) tablet 1,000 mg  1,000 mg Oral Once Gaynelle Adu, MD      . gabapentin (NEURONTIN) 300 MG capsule           . gabapentin (NEURONTIN) capsule 600 mg  600 mg Oral Once Gaynelle Adu, MD      . vancomycin [facility]) 1-5 GM/200ML-% IVPB           . vancomycin (VANCOCIN) IVPB 1000 mg/200 mL premix  1,000 mg Intravenous On Call to OR Delight Ovens, MD        Pertinent items are noted in HPI.   Review of Systems:     Cardiac Review of Systems: Y or N  Chest Pain [ y   ]  Resting SOB Cove.Etienne   ] Exertional SOB  Cove.Etienne  ]  Orthopnea Cove.Etienne  ]   Pedal Edema [ n  ]    Palpitations [ y ] Syncope  [ n ]   Presyncope [n   ]  General Review of Systems: [Y] = yes [  ]=no Constitional: recent weight change [  ];  Wt loss over the last 3 months [   ] anorexia [  ]; fatigue [  ]; nausea [  ]; night sweats [  ]; fever [  ]; or chills [  ];          Dental: poor dentition[  ]; Last Dentist visit:   Eye : blurred vision [  ]; diplopia [   ]; vision changes [  ];  Amaurosis fugax[  ]; Resp: cough [  ];  wheezing[ n ];  hemoptysis[ n ]; shortness of breath[ y ]; paroxysmal nocturnal dyspnea[  ]; dyspnea on exertion[  ]; or orthopnea[  ];  GI:  gallstones[  ], vomiting[  ];  dysphagia[  ]; melena[  ];  hematochezia [  ]; heartburn[  ];   Hx of  Colonoscopy[  ]; GU: kidney stones [  ]; hematuria[  ];   dysuria [  ];  nocturia[  ];  history of     obstruction [  ]; urinary frequency [  ]             Skin: rash, swelling[  ];, hair loss[  ];   peripheral edema[  ];  or itching[  ]; Musculosketetal: myalgias[  ];  joint swelling[  ];  joint erythema[  ];  joint pain[  ];  back pain[  ];  Heme/Lymph: bruising[  ];  bleeding[  ];  anemia[  ];  Neuro: TIA[  ];  headaches[  ];  stroke[  ];  vertigo[  ];  seizures[  ];   paresthesias[  ];  difficulty walking[  ];  Psych:depression[  ]; anxiety[ y ];  Endocrine: diabetes[ n ];  thyroid dysfunction[ n ];  Immunizations: Flu up to date [  ]; Pneumococcal up to date [  ];  Other:  Physical Exam: BP (!) 140/95   Pulse 82   Temp 97.6 F (36.4 C) (Oral)   Resp 18   Wt 183 lb (83 kg)   SpO2 100%   BMI 26.26 kg/m   PHYSICAL EXAMINATION: General appearance: alert, cooperative, appears stated age and no distress Head: Normocephalic, without obvious abnormality, atraumatic Neck: no adenopathy, no carotid bruit, no JVD, supple, symmetrical, trachea midline and thyroid not enlarged, symmetric, no tenderness/mass/nodules Lymph nodes: Cervical, supraclavicular, and axillary nodes normal. Resp: clear to auscultation bilaterally Back: symmetric, no curvature. ROM normal. No CVA tenderness. Cardio: regular rate and rhythm, S1, S2 normal, no murmur, click, rub or gallop GI: soft, non-tender; bowel sounds normal; no masses,  no organomegaly Extremities: extremities normal, atraumatic, no cyanosis or edema and Homans sign is negative, no sign of DVT Neurologic: Grossly normal  No physical evidence of Marfans  Chest:      Diagnostic Studies & Laboratory data:     Recent Radiology Findings:  Dg Chest 2 View  Result Date: 03/28/2017 CLINICAL DATA:  Sternum repair, preoperative evaluation. EXAM: CHEST  2 VIEW COMPARISON:  None available for comparison at time of study interpretation. FINDINGS: Cardiomediastinal silhouette is normal. No pleural effusions or focal consolidations. Trachea projects midline and there is no pneumothorax. Soft tissue planes and included osseous structures are  non-suspicious. Severe pectus excavatum. IMPRESSION: Severe pectus excavatum, otherwise negative chest radiograph. Electronically Signed   By: Awilda Metro M.D.   On: 03/28/2017 06:19   Addendum   ADDENDUM REPORT: 12/21/2016 12:59  ADDENDUM: Addendum created to address request for Haller index:  Transverse diameter of thorax:  (image 106)  AP diameter of thorax: 76mm  Haller Index: 3.7   Electronically Signed   By: Gilmer Mor D.O.   On: 12/21/2016 12:59   Addended by Gilmer Mor, DO on 12/21/2016 1:01 PM    Study Result   CLINICAL DATA:  36 year old male with a history of pectus excavatum. Shortness of breath  EXAM: CT CHEST WITHOUT CONTRAST  TECHNIQUE: Multidetector CT imaging of the chest was performed following the standard protocol without IV contrast.  COMPARISON:  None.  FINDINGS: Cardiovascular: No significant vascular findings. Normal heart size. No pericardial effusion.  Mediastinum/Nodes: No enlarged mediastinal or axillary lymph nodes. Thyroid gland, trachea, and esophagus demonstrate no significant findings. 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  Lungs/Pleura: Lungs are clear. No pleural effusion or  pneumothorax.  Upper Abdomen: No acute abnormality.  Musculoskeletal: No chest wall mass or suspicious bone lesions identified.  IMPRESSION: No acute CT finding.  Electronically Signed: By: Gilmer Mor D.O. On: 12/17/2016 16:42          PFT's  FEV1 4.38 100% DLCO 8.67  26% Interpretation: 1. spirometry is normal.no bronchodilator improvement. 2.lung volumes are normal 3. airway resistance is elevated. 4.DLCO severely reduced    echocardiogram Transthoracic Echocardiography  Patient:    Marcus Ellis, Marcus Ellis MR #:       [id number] Study Date: 11/10/2016 Gender:     M Age:        36 Height:     177.8 cm Weight:     85.5 kg BSA:        2.07 m^2 Pt. Status: Room:   SONOGRAPHER  [facility]  [place]  ATTENDING    Prentice Docker, MD  ORDERING     Prentice Docker, MD  REFERRING    Prentice Docker, MD  PERFORMING   [facility], [place]  cc:  ------------------------------------------------------------------- LV EF: 40% -   45%  ------------------------------------------------------------------- History:   PMH:   Chest pain. RBBB.  Risk factors:  Lifelong nonsmoker.  ------------------------------------------------------------------- Study Conclusions  - Left ventricle: The cavity size was normal. Wall thickness was   normal. Systolic function was mildly to moderately reduced. The   estimated ejection fraction was in the range of 40% to 45%.   Diffuse hypokinesis. The study is not technically sufficient to   allow evaluation of LV diastolic function. There was no evidence   of elevated ventricular filling pressure by Doppler parameters. - Aortic valve: Valve area (VTI): 3.85 cm^2. Valve area (Vmax):   3.12 cm^2. Valve area (Vmean): 3.22 cm^2. - Right ventricle: The ventricular septum is flattened in systole,   suggesting RV pressure overload.  ------------------------------------------------------------------- Study data:  No prior study was available for comparison.  Study status:  Routine.  Procedure:  The patient reported no pain pre or post test. Transthoracic echocardiography. Image quality was adequate.  Study completion:  There were no complications. Transthoracic echocardiography.  M-mode, complete 2D, spectral Doppler, and color Doppler.  Birthdate:  Patient birthdate: [birth year].  Age:  Patient is 36 yr old.  Sex:  Gender: male. BMI: 27 kg/m^2.  Blood pressure:     128/89  Patient status: Outpatient.  Study date:  Study date: 11/10/2016. Study time: 04:21 PM.  -------------------------------------------------------------------  ------------------------------------------------------------------- Left ventricle:  The cavity size was normal. Wall  thickness was normal. Systolic function was mildly to moderately reduced. The estimated ejection fraction was in the range of 40% to 45%. Diffuse hypokinesis. The study is not technically sufficient to allow evaluation of LV diastolic function. There was no evidence of elevated ventricular filling pressure by Doppler parameters.  ------------------------------------------------------------------- Aortic valve:   Trileaflet; normal thickness leaflets.  Doppler: There was no stenosis.   There was no significant regurgitation. VTI ratio of LVOT to aortic valve: 0.85. Valve area (VTI): 3.85 cm^2. Indexed valve area (VTI): 1.86 cm^2/m^2. Peak velocity ratio of LVOT to aortic valve: 0.69. Valve area (Vmax): 3.12 cm^2. Indexed valve area (Vmax): 1.51 cm^2/m^2. Mean velocity ratio of LVOT to aortic valve: 0.71. Valve area (Vmean): 3.22 cm^2. Indexed valve area (Vmean): 1.56 cm^2/m^2.    Mean gradient (S): 2 mm Hg. Peak gradient (S): 4 mm Hg.  ------------------------------------------------------------------- Aorta:  Aortic root: The aortic root was normal in size.  ------------------------------------------------------------------- Mitral valve:   Normal thickness leaflets .  Doppler:  There was no evidence for stenosis.   There was no significant regurgitation.   ------------------------------------------------------------------- Left atrium:  The atrium was normal in size.  ------------------------------------------------------------------- Atrial septum:  Poorly visualized.  ------------------------------------------------------------------- Right ventricle:  The ventricular septum is flattened in systole, suggesting RV pressure overload. The cavity size was normal. Systolic function was normal.  ------------------------------------------------------------------- Pulmonic valve:   Not well visualized.  Doppler:   There was no evidence for stenosis.   There was no significant  regurgitation.   ------------------------------------------------------------------- Tricuspid valve:   Normal thickness leaflets.  Doppler:   There was no evidence for stenosis.   There was trivial regurgitation.  ------------------------------------------------------------------- Pulmonary artery:    Systolic pressure could not be accurately estimated.   Inadequate TR jet.  ------------------------------------------------------------------- Right atrium:  The atrium was normal in size.  ------------------------------------------------------------------- Pericardium:  Not well visualized. There was no pericardial effusion.  ------------------------------------------------------------------- Measurements   Left ventricle                           Value           Reference  LV ID, ED, PLAX chordal                  47.5   mm       43 - 52  LV ID, ES, PLAX chordal                  37.4   mm       23 - 38  LV fx shortening, PLAX chordal    (L)    21     %        >=29  LV PW thickness, ED                      9      mm       ---------  IVS/LV PW ratio, ED                      0.95            <=1.3  Longitudinal strain, TDI                 15     %        ---------    Ventricular septum                       Value           Reference  IVS thickness, ED                        8.52   mm       ---------    LVOT                                     Value           Reference  LVOT ID, S                               24     mm       ---------  LVOT area  4.52   cm^2     ---------  LVOT peak velocity, S                    71.7   cm/s     ---------  LVOT mean velocity, S                    48.3   cm/s     ---------  LVOT VTI, S                              12.6   cm       ---------    Aortic valve                             Value           Reference  Aortic valve peak velocity, S            104.62 cm/s     ---------  Aortic valve mean velocity, S             67.8   cm/s     ---------  Aortic valve VTI, S                      14.86  cm       ---------  Aortic mean gradient, S                  2      mm Hg    ---------  Aortic peak gradient, S                  4      mm Hg    ---------  VTI ratio, LVOT/AV                       0.85            ---------  Aortic valve area, VTI                   3.85   cm^2     ---------  Aortic valve area/bsa, VTI               1.86   cm^2/m^2 ---------  Velocity ratio, peak, LVOT/AV            0.69            ---------  Aortic valve area, peak velocity         3.12   cm^2     ---------  Aortic valve area/bsa, peak              1.51   cm^2/m^2 ---------  velocity  Velocity ratio, mean, LVOT/AV            0.71            ---------  Aortic valve area, mean velocity         3.22   cm^2     ---------  Aortic valve area/bsa, mean              1.56   cm^2/m^2 ---------  velocity    Aorta  Value           Reference  Aortic root ID, ED                       34     mm       ---------    Left atrium                              Value           Reference  LA ID, A-P, ES                           29     mm       ---------  LA volume/bsa, ES, 1-p A4C               15.6   ml/m^2   ---------    Mitral valve                             Value           Reference  Mitral E-wave peak velocity              43.2   cm/s     ---------  Mitral A-wave peak velocity              48     cm/s     ---------  Mitral deceleration time                 158    ms       150 - 230  Mitral E/A ratio, peak                   0.9             ---------  Mitral maximal regurg velocity,          99.1   cm/s     ---------  PISA    Right ventricle                          Value           Reference  TAPSE                                    18     mm       ---------  RV s&', lateral, S                        13.2   cm/s     ---------  Legend: (L)  and  (H)  mark values outside specified reference  range.  ------------------------------------------------------------------- Prepared and Electronically Authenticated by  Patrick JupiterJonathon Branch, M.D. 2018-06-20T18:50:41  Study Highlights    Blood pressure demonstrated a normal response to exercise.  There was no ST segment deviation noted during stress. Negative ekg stress test for ischemia  Duke treadmill score of 12 consistent with low risk for major cardiac events.    Stress Findings   ECG Baseline ECG exhibits normal sinus rhythm.Baseline ECG indicates right bundle branch block. .    Stress Findings The patient exercised following the Bruce protocol.  The patient reported no symptoms during the stress test. The patient experienced no angina during the stress test.   Test was stopped per protocol.   Blood pressure and heart rate demonstrated a normal response to exercise. Blood pressure demonstrated a normal response to exercise. Overall, the patient's exercise capacity was normal.   85% of maximum heart rate was achieved after 10 minutes. Recovery time: 7.3 minutes. The patient's response to exercise was adequate for diagnosis.    Response to Stress There was no ST segment deviation noted during stress.  Arrhythmias during stress: none.  Arrhythmias during recovery: none.  There were no significant arrhythmias noted during the test.  ECG was interpretable and conclusive.    Stress Measurements   Baseline Vitals  Rest HR 86 bpm    Rest BP 130/92 mmHg    Exercise Time  Exercise duration (min) 12 min    Exercise duration (sec) 0 sec    Peak Stress Vitals  Peak HR 171 bpm    Peak BP 173/99 mmHg    Exercise Data  MPHR 186 bpm    Percent HR 91 %    RPE 14     Estimated workload 13.4 METS       Signed   Electronically signed by Antoine Poche, MD on 12/20/16 at 1422 EDT   Narrative   Referred for: Assessment of Functional Capacity with Pectus Excavatum  Procedure: This patient underwent staged  symptom-limited exercise treadmill testing using a individualized treadmill protocol with expired gas analysis metabolic evaluation during exercise.  Demographics  Age: 38 Ht. (in.) 71 Wt. (lb) 183 BMI: 25.5   Predicted Peak VO2: 42.3  Gender: Male Ht (cm) 180.3 Wt. (kg) 83    Results  Pre-Exercise PFTs   FVC 4.94  (89%)     FEV1 4.13 (91%)      FEV1/FVC 84 (101%)      MVV 173 (98%)      Exercise Time:  7:30  Speed (mph): 6.0    Grade (%): 6.0    RPE: 18  Reason stopped: Dyspnea (8/10)  Additional symptoms: lightheaded (8/10)  Resting HR: 108 Peak HR: 181  (97% age predicted max HR)  BP rest: 136/98 BP peak: 180/98  Peak VO2: 36.2 (86% predicted peak VO2)  VE/VCO2 slope: 38  OUES: 2.61  Peak RER: 1.14  Ventilatory Threshold: 29.9 (71% predicted or measured peak VO2)  Peak RR 59  Peak Ventilation: 134.5  VE/MVV: 78%  PETCO2 at peak: 29  O2pulse: 17  (89% predicted O2pulse0   Interpretation  Notes: Patient gave an excellent effort. Pulse-oximetry gradually decreased to 93% at peak exercise. Exercise was performed on on a treadmill beginning at 3.61mph and 0% grade increasing 1.0 mph and 2.0% grade every other minute.   ECG: Resting ECG in normal sinus rhythm with right bundle branch block. HR response appropriate. There were no sustained arrhythmias and no ST-T changes. BP response appropriate.   PFT: Pre-exercise spirometry was within normal limits. The MVV was normal.   CPX: Exercise testing with gas exchange demonstrates a mildly peak VO2 of 36.2 ml/kg/min (86% of the age/gender/weight matched sedentary norms). The RER of 1.14 indicates a maximal effort. When adjusted to the patient's ideal body weight of 180.3 lb (81.8 kg) the peak VO2 is 36.7 ml/kg (ibw)/min (87% of the ibw-adjusted predicted). The VE/VCO2 slope is elevated and indicates excessive dead space ventilation. The oxygen uptake efficiency slope (OUES) is  mildly reduced but within normal range. The VO2  at the ventilatory threshold was normal at 71% of the predicted peak VO2. At peak exercise, the ventilation reached 78% of the measured MVV indicating ventilatory limits were nearly achieved. The O2pulse (a surrogate for stroke volume) increased with incremental exercise, reaching peak at 17 ml/beat (89% predicted).    Conclusion: Exercise testing with gas exchange demonstrates normal functional capacity when compared to matches sedentary norms. Patient nearly achieved ventilatory limits and began desaturations near peak exercise. Although PVO2 and aerobic capacity normal, elevated Ve/VCo2 slope and mild desaturations indicate increased pressures with circulatory limitation.   Test, report and preliminary impression by: Lesia Hausen, MS, ACSM-RCEP 02/02/2017 4:32 PM     Recent Lab Findings: Lab Results  Component Value Date   WBC 6.8 03/24/2017   HGB 15.1 03/24/2017   HCT 42.8 03/24/2017   PLT 197 03/24/2017   GLUCOSE 97 03/24/2017   ALT 22 03/24/2017   AST 16 03/24/2017   NA 134 (L) 03/24/2017   K 4.1 03/24/2017   CL 103 03/24/2017   CREATININE 1.09 03/24/2017   BUN 13 03/24/2017   CO2 21 (L) 03/24/2017   INR 1.04 03/24/2017      Assessment / Plan:   Patient with pectus excavatum with a Haller index of 3.7, significant diffusion capacity deficit on PFTs with normal volumes, depressed ejection fraction 40%. I've examined the patient and discussed with him the severity of his pectus. Repair has been recommended.  The risks and options were again discussed with the patient including proceeding with modified Ravitch procedure versus a Nuss bar.  Referral to [facility] was offered. The patient wishes to proceed with modified Ravitch The goals risks and alternatives of the planned surgical procedure Procedure(s): PECTUS EXCAVATUM RECONSTRUCTION (N/A)  have been discussed with the patient in detail. The risks of the procedure including  death, infection, stroke, myocardial infarction,recurrent defect, pneumothorax,  bleeding, blood transfusion have all been discussed specifically.  I have quoted Marcus Ellis a 2 % of perioperative mortality and a complication rate as high as 30 %. The patient's questions have been answered.Marcus Ellis is willing  to proceed with the planned procedure.  Delight Ovens MD      [address] [place] [place] [address] Office [phone]   Beeper [phone]  03/28/2017 7:01 AM

## 2017-03-28 NOTE — Anesthesia Procedure Notes (Signed)
Procedure Name: Intubation Date/Time: 03/28/2017 7:44 AM Performed by: Rosiland OzMeyers, Jaken Fregia, CRNA Pre-anesthesia Checklist: Emergency Drugs available, Suction available, Patient being monitored, Timeout performed and Patient identified Patient Re-evaluated:Patient Re-evaluated prior to induction Oxygen Delivery Method: Circle system utilized Preoxygenation: Pre-oxygenation with 100% oxygen Induction Type: IV induction Ventilation: Mask ventilation without difficulty Laryngoscope Size: Miller and 3 Grade View: Grade I Tube type: Oral Tube size: 7.5 mm Number of attempts: 1 Airway Equipment and Method: Stylet Placement Confirmation: ETT inserted through vocal cords under direct vision,  positive ETCO2 and breath sounds checked- equal and bilateral Secured at: 23 cm Tube secured with: Tape Dental Injury: Teeth and Oropharynx as per pre-operative assessment

## 2017-03-28 NOTE — Progress Notes (Signed)
CT surgery p.m. Rounds  Comfortable after pectus repair using PCA pump Minimal drainage into JP drain bulb Vital signs stable

## 2017-03-28 NOTE — Anesthesia Postprocedure Evaluation (Signed)
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 (N/A Chest) STERNAL PLATING     Patient location during evaluation: PACU Anesthesia Type: General and Regional Level of consciousness: awake and alert Pain management: pain level controlled Vital Signs Assessment: post-procedure vital signs reviewed and stable Respiratory status: spontaneous breathing, nonlabored ventilation and respiratory function stable Cardiovascular status: blood pressure returned to baseline and stable Postop Assessment: no apparent nausea or vomiting Anesthetic complications: no    Last Vitals:  Vitals:   03/28/17 1525 03/28/17 1531  BP: 136/83   Pulse: 84   Resp: 13 12  Temp: 36.7 C   SpO2: 95% 95%    Last Pain:  Vitals:   03/28/17 1531  TempSrc:   PainSc: Asleep                 Allye Hoyos,W. EDMOND

## 2017-03-28 NOTE — Progress Notes (Signed)
[address]       [place] [address]             [phone]                    Marcus Ellis [facility] Medical Record #[id number] Date of Birth: [birth year]  Referring: No ref. provider found Primary Care: Selinda Flavin, MD  Chief Complaint:    No chief complaint on file.   History of Present Illness:    Marcus Ellis 36 y.o. male  Has been followed in the office for consideration of pectus repair . He was originally seen for evaluation of recent history of palpitations and exertional dyspnea. The patient has a history of known pectus excavatum and has been  evaluated multiple times in the past including in 2008 well he was in the [employer]. He notes his pulmonary function studies were normal at that time , He provided copies today. The patient works as a Engineer, civil (consulting) . EKG on himself and noticed a right bundle branch block, he did this because he noted palpitations and increasing shortness of breath with exertion. Cardiology evaluation was performed including echocardiogram which showed a decreased ejection fraction 40-50%. CT scan of the chest was performed demonstrating a Haller index of 3.7. Stress test was negative for ischemia. PFTs were done demonstrating normal volumes and significantly decreased diffusion capacity. The patient is referred for consideration of pectus repair.   Cardiopulmonary stress test has been preformed.  The patient is a previous smoker, up to a pack a day for 3-5 years quit in 2006. Growing up visit child he denies any asthmatic lung disease. Participated in sports including basketball wrestling and was always able to keep up with his peers.    Current Activity/ Functional Status:  Patient is independent with mobility/ambulation, transfers, ADL's, IADL's.   Zubrod Score: At the time of surgery this patient's most appropriate activity status/level should be described as: [x]     0    Normal activity, no symptoms []     1     Restricted in physical strenuous activity but ambulatory, able to do out light work []     2    Ambulatory and capable of self care, unable to do work activities, up and about               >50 % of waking hours                              []     3    Only limited self care, in bed greater than 50% of waking hours []     4    Completely disabled, no self care, confined to bed or chair []     5    Moribund   Past Medical History:  Diagnosis Date  . ADD (attention deficit disorder)   . GERD (gastroesophageal reflux disease)   . Palpitations   . Pectus excavatum   . RBBB (right bundle branch block)   . Wears contact lenses   . Wears glasses     Past Surgical History:  Procedure Laterality Date  . NOSE SURGERY      Family History  Problem Relation Age of Onset  . High blood pressure Mother   . High blood pressure Father     Social History   Social History  . Marital status: Married    Spouse  name: N/A  . Number of children: N/A  . Years of education: N/A   Occupational History  . Not on file.   Social History Main Topics  . Smoking status: Smoked 3-5 years quit 2006   . Smokeless tobacco: Current User  . Alcohol use Not on file  . Drug use: Unknown  . Sexual activity: Not on file   Other Topics Concern  . Not on file   Social History Narrative  . No narrative on file    Social History   Tobacco Use  Smoking Status Former Smoker  . Packs/day: 1.00  . Years: 4.00  . Pack years: 4.00  Smokeless Tobacco Current User  . Types: Chew  Tobacco Comment   quit smoking in 2006    Social History   Substance and Sexual Activity  Alcohol Use Yes   Comment: occ     Allergies  Allergen Reactions  . Bee Venom Hives  . Penicillins Other (See Comments)    UNSPECIFIED REACTION AS A CHILD  Has patient had a PCN reaction causing immediate rash, facial/tongue/throat swelling, SOB or lightheadedness with hypotension: Unknown Has patient had a PCN reaction causing  severe rash involving mucus membranes or skin necrosis: Unknown Has patient had a PCN reaction that required hospitalization: Unknown Has patient had a PCN reaction occurring within the last 10 years: No If all of the above answers are "NO", then may proceed with Cephalosporin use.     Current Facility-Administered Medications  Medication Dose Route Frequency Provider Last Rate Last Dose  . acetaminophen (TYLENOL) 500 MG tablet           . acetaminophen (TYLENOL) tablet 1,000 mg  1,000 mg Oral Once Gaynelle Adu, MD      . gabapentin (NEURONTIN) 300 MG capsule           . gabapentin (NEURONTIN) capsule 600 mg  600 mg Oral Once Gaynelle Adu, MD      . vancomycin [facility]) 1-5 GM/200ML-% IVPB           . vancomycin (VANCOCIN) IVPB 1000 mg/200 mL premix  1,000 mg Intravenous On Call to OR Delight Ovens, MD        Pertinent items are noted in HPI.   Review of Systems:     Cardiac Review of Systems: Y or N  Chest Pain [ y   ]  Resting SOB Cove.Etienne   ] Exertional SOB  Cove.Etienne  ]  Orthopnea Cove.Etienne  ]   Pedal Edema [ n  ]    Palpitations [ y ] Syncope  [ n ]   Presyncope [n   ]  General Review of Systems: [Y] = yes [  ]=no Constitional: recent weight change [  ];  Wt loss over the last 3 months [   ] anorexia [  ]; fatigue [  ]; nausea [  ]; night sweats [  ]; fever [  ]; or chills [  ];          Dental: poor dentition[  ]; Last Dentist visit:   Eye : blurred vision [  ]; diplopia [   ]; vision changes [  ];  Amaurosis fugax[  ]; Resp: cough [  ];  wheezing[ n ];  hemoptysis[ n ]; shortness of breath[ y ]; paroxysmal nocturnal dyspnea[  ]; dyspnea on exertion[  ]; or orthopnea[  ];  GI:  gallstones[  ], vomiting[  ];  dysphagia[  ]; melena[  ];  hematochezia [  ]; heartburn[  ];   Hx of  Colonoscopy[  ]; GU: kidney stones [  ]; hematuria[  ];   dysuria [  ];  nocturia[  ];  history of     obstruction [  ]; urinary frequency [  ]             Skin: rash, swelling[  ];, hair loss[  ];   peripheral edema[  ];  or itching[  ]; Musculosketetal: myalgias[  ];  joint swelling[  ];  joint erythema[  ];  joint pain[  ];  back pain[  ];  Heme/Lymph: bruising[  ];  bleeding[  ];  anemia[  ];  Neuro: TIA[  ];  headaches[  ];  stroke[  ];  vertigo[  ];  seizures[  ];   paresthesias[  ];  difficulty walking[  ];  Psych:depression[  ]; anxiety[ y ];  Endocrine: diabetes[ n ];  thyroid dysfunction[ n ];  Immunizations: Flu up to date [  ]; Pneumococcal up to date [  ];  Other:  Physical Exam: BP (!) 140/95   Pulse 82   Temp 97.6 F (36.4 C) (Oral)   Resp 18   Wt 183 lb (83 kg)   SpO2 100%   BMI 26.26 kg/m   PHYSICAL EXAMINATION: General appearance: alert, cooperative, appears stated age and no distress Head: Normocephalic, without obvious abnormality, atraumatic Neck: no adenopathy, no carotid bruit, no JVD, supple, symmetrical, trachea midline and thyroid not enlarged, symmetric, no tenderness/mass/nodules Lymph nodes: Cervical, supraclavicular, and axillary nodes normal. Resp: clear to auscultation bilaterally Back: symmetric, no curvature. ROM normal. No CVA tenderness. Cardio: regular rate and rhythm, S1, S2 normal, no murmur, click, rub or gallop GI: soft, non-tender; bowel sounds normal; no masses,  no organomegaly Extremities: extremities normal, atraumatic, no cyanosis or edema and Homans sign is negative, no sign of DVT Neurologic: Grossly normal  No physical evidence of Marfans  Chest:      Diagnostic Studies & Laboratory data:     Recent Radiology Findings:  Dg Chest 2 View  Result Date: 03/28/2017 CLINICAL DATA:  Sternum repair, preoperative evaluation. EXAM: CHEST  2 VIEW COMPARISON:  None available for comparison at time of study interpretation. FINDINGS: Cardiomediastinal silhouette is normal. No pleural effusions or focal consolidations. Trachea projects midline and there is no pneumothorax. Soft tissue planes and included osseous structures are  non-suspicious. Severe pectus excavatum. IMPRESSION: Severe pectus excavatum, otherwise negative chest radiograph. Electronically Signed   By: Awilda Metro M.D.   On: 03/28/2017 06:19   Addendum   ADDENDUM REPORT: 12/21/2016 12:59  ADDENDUM: Addendum created to address request for Haller index:  Transverse diameter of thorax:  (image 106)  AP diameter of thorax: 76mm  Haller Index: 3.7   Electronically Signed   By: Gilmer Mor D.O.   On: 12/21/2016 12:59   Addended by Gilmer Mor, DO on 12/21/2016 1:01 PM    Study Result   CLINICAL DATA:  36 year old male with a history of pectus excavatum. Shortness of breath  EXAM: CT CHEST WITHOUT CONTRAST  TECHNIQUE: Multidetector CT imaging of the chest was performed following the standard protocol without IV contrast.  COMPARISON:  None.  FINDINGS: Cardiovascular: No significant vascular findings. Normal heart size. No pericardial effusion.  Mediastinum/Nodes: No enlarged mediastinal or axillary lymph nodes. Thyroid gland, trachea, and esophagus demonstrate no significant findings. 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  Lungs/Pleura: Lungs are clear. No pleural effusion or  pneumothorax.  Upper Abdomen: No acute abnormality.  Musculoskeletal: No chest wall mass or suspicious bone lesions identified.  IMPRESSION: No acute CT finding.  Electronically Signed: By: Gilmer Mor D.O. On: 12/17/2016 16:42          PFT's  FEV1 4.38 100% DLCO 8.67  26% Interpretation: 1. spirometry is normal.no bronchodilator improvement. 2.lung volumes are normal 3. airway resistance is elevated. 4.DLCO severely reduced    echocardiogram Transthoracic Echocardiography  Patient:    Marcus Ellis, Marcus Ellis MR #:       [id number] Study Date: 11/10/2016 Gender:     M Age:        36 Height:     177.8 cm Weight:     85.5 kg BSA:        2.07 m^2 Pt. Status: Room:   SONOGRAPHER  [facility]  [place]  ATTENDING    Prentice Docker, MD  ORDERING     Prentice Docker, MD  REFERRING    Prentice Docker, MD  PERFORMING   [facility], [place]  cc:  ------------------------------------------------------------------- LV EF: 40% -   45%  ------------------------------------------------------------------- History:   PMH:   Chest pain. RBBB.  Risk factors:  Lifelong nonsmoker.  ------------------------------------------------------------------- Study Conclusions  - Left ventricle: The cavity size was normal. Wall thickness was   normal. Systolic function was mildly to moderately reduced. The   estimated ejection fraction was in the range of 40% to 45%.   Diffuse hypokinesis. The study is not technically sufficient to   allow evaluation of LV diastolic function. There was no evidence   of elevated ventricular filling pressure by Doppler parameters. - Aortic valve: Valve area (VTI): 3.85 cm^2. Valve area (Vmax):   3.12 cm^2. Valve area (Vmean): 3.22 cm^2. - Right ventricle: The ventricular septum is flattened in systole,   suggesting RV pressure overload.  ------------------------------------------------------------------- Study data:  No prior study was available for comparison.  Study status:  Routine.  Procedure:  The patient reported no pain pre or post test. Transthoracic echocardiography. Image quality was adequate.  Study completion:  There were no complications. Transthoracic echocardiography.  M-mode, complete 2D, spectral Doppler, and color Doppler.  Birthdate:  Patient birthdate: [birth year].  Age:  Patient is 36 yr old.  Sex:  Gender: male. BMI: 27 kg/m^2.  Blood pressure:     128/89  Patient status: Outpatient.  Study date:  Study date: 11/10/2016. Study time: 04:21 PM.  -------------------------------------------------------------------  ------------------------------------------------------------------- Left ventricle:  The cavity size was normal. Wall  thickness was normal. Systolic function was mildly to moderately reduced. The estimated ejection fraction was in the range of 40% to 45%. Diffuse hypokinesis. The study is not technically sufficient to allow evaluation of LV diastolic function. There was no evidence of elevated ventricular filling pressure by Doppler parameters.  ------------------------------------------------------------------- Aortic valve:   Trileaflet; normal thickness leaflets.  Doppler: There was no stenosis.   There was no significant regurgitation. VTI ratio of LVOT to aortic valve: 0.85. Valve area (VTI): 3.85 cm^2. Indexed valve area (VTI): 1.86 cm^2/m^2. Peak velocity ratio of LVOT to aortic valve: 0.69. Valve area (Vmax): 3.12 cm^2. Indexed valve area (Vmax): 1.51 cm^2/m^2. Mean velocity ratio of LVOT to aortic valve: 0.71. Valve area (Vmean): 3.22 cm^2. Indexed valve area (Vmean): 1.56 cm^2/m^2.    Mean gradient (S): 2 mm Hg. Peak gradient (S): 4 mm Hg.  ------------------------------------------------------------------- Aorta:  Aortic root: The aortic root was normal in size.  ------------------------------------------------------------------- Mitral valve:   Normal thickness leaflets .  Doppler:  There was no evidence for stenosis.   There was no significant regurgitation.   ------------------------------------------------------------------- Left atrium:  The atrium was normal in size.  ------------------------------------------------------------------- Atrial septum:  Poorly visualized.  ------------------------------------------------------------------- Right ventricle:  The ventricular septum is flattened in systole, suggesting RV pressure overload. The cavity size was normal. Systolic function was normal.  ------------------------------------------------------------------- Pulmonic valve:   Not well visualized.  Doppler:   There was no evidence for stenosis.   There was no significant  regurgitation.   ------------------------------------------------------------------- Tricuspid valve:   Normal thickness leaflets.  Doppler:   There was no evidence for stenosis.   There was trivial regurgitation.  ------------------------------------------------------------------- Pulmonary artery:    Systolic pressure could not be accurately estimated.   Inadequate TR jet.  ------------------------------------------------------------------- Right atrium:  The atrium was normal in size.  ------------------------------------------------------------------- Pericardium:  Not well visualized. There was no pericardial effusion.  ------------------------------------------------------------------- Measurements   Left ventricle                           Value           Reference  LV ID, ED, PLAX chordal                  47.5   mm       43 - 52  LV ID, ES, PLAX chordal                  37.4   mm       23 - 38  LV fx shortening, PLAX chordal    (L)    21     %        >=29  LV PW thickness, ED                      9      mm       ---------  IVS/LV PW ratio, ED                      0.95            <=1.3  Longitudinal strain, TDI                 15     %        ---------    Ventricular septum                       Value           Reference  IVS thickness, ED                        8.52   mm       ---------    LVOT                                     Value           Reference  LVOT ID, S                               24     mm       ---------  LVOT area  4.52   cm^2     ---------  LVOT peak velocity, S                    71.7   cm/s     ---------  LVOT mean velocity, S                    48.3   cm/s     ---------  LVOT VTI, S                              12.6   cm       ---------    Aortic valve                             Value           Reference  Aortic valve peak velocity, S            104.62 cm/s     ---------  Aortic valve mean velocity, S             67.8   cm/s     ---------  Aortic valve VTI, S                      14.86  cm       ---------  Aortic mean gradient, S                  2      mm Hg    ---------  Aortic peak gradient, S                  4      mm Hg    ---------  VTI ratio, LVOT/AV                       0.85            ---------  Aortic valve area, VTI                   3.85   cm^2     ---------  Aortic valve area/bsa, VTI               1.86   cm^2/m^2 ---------  Velocity ratio, peak, LVOT/AV            0.69            ---------  Aortic valve area, peak velocity         3.12   cm^2     ---------  Aortic valve area/bsa, peak              1.51   cm^2/m^2 ---------  velocity  Velocity ratio, mean, LVOT/AV            0.71            ---------  Aortic valve area, mean velocity         3.22   cm^2     ---------  Aortic valve area/bsa, mean              1.56   cm^2/m^2 ---------  velocity    Aorta  Value           Reference  Aortic root ID, ED                       34     mm       ---------    Left atrium                              Value           Reference  LA ID, A-P, ES                           29     mm       ---------  LA volume/bsa, ES, 1-p A4C               15.6   ml/m^2   ---------    Mitral valve                             Value           Reference  Mitral E-wave peak velocity              43.2   cm/s     ---------  Mitral A-wave peak velocity              48     cm/s     ---------  Mitral deceleration time                 158    ms       150 - 230  Mitral E/A ratio, peak                   0.9             ---------  Mitral maximal regurg velocity,          99.1   cm/s     ---------  PISA    Right ventricle                          Value           Reference  TAPSE                                    18     mm       ---------  RV s&', lateral, S                        13.2   cm/s     ---------  Legend: (L)  and  (H)  mark values outside specified reference  range.  ------------------------------------------------------------------- Prepared and Electronically Authenticated by  Patrick JupiterJonathon Branch, M.D. 2018-06-20T18:50:41  Study Highlights    Blood pressure demonstrated a normal response to exercise.  There was no ST segment deviation noted during stress. Negative ekg stress test for ischemia  Duke treadmill score of 12 consistent with low risk for major cardiac events.    Stress Findings   ECG Baseline ECG exhibits normal sinus rhythm.Baseline ECG indicates right bundle branch block. .    Stress Findings The patient exercised following the Bruce protocol.  The patient reported no symptoms during the stress test. The patient experienced no angina during the stress test.   Test was stopped per protocol.   Blood pressure and heart rate demonstrated a normal response to exercise. Blood pressure demonstrated a normal response to exercise. Overall, the patient's exercise capacity was normal.   85% of maximum heart rate was achieved after 10 minutes. Recovery time: 7.3 minutes. The patient's response to exercise was adequate for diagnosis.    Response to Stress There was no ST segment deviation noted during stress.  Arrhythmias during stress: none.  Arrhythmias during recovery: none.  There were no significant arrhythmias noted during the test.  ECG was interpretable and conclusive.    Stress Measurements   Baseline Vitals  Rest HR 86 bpm    Rest BP 130/92 mmHg    Exercise Time  Exercise duration (min) 12 min    Exercise duration (sec) 0 sec    Peak Stress Vitals  Peak HR 171 bpm    Peak BP 173/99 mmHg    Exercise Data  MPHR 186 bpm    Percent HR 91 %    RPE 14     Estimated workload 13.4 METS       Signed   Electronically signed by Antoine Poche, MD on 12/20/16 at 1422 EDT   Narrative   Referred for: Assessment of Functional Capacity with Pectus Excavatum  Procedure: This patient underwent staged  symptom-limited exercise treadmill testing using a individualized treadmill protocol with expired gas analysis metabolic evaluation during exercise.  Demographics  Age: 38 Ht. (in.) 71 Wt. (lb) 183 BMI: 25.5   Predicted Peak VO2: 42.3  Gender: Male Ht (cm) 180.3 Wt. (kg) 83    Results  Pre-Exercise PFTs   FVC 4.94  (89%)     FEV1 4.13 (91%)      FEV1/FVC 84 (101%)      MVV 173 (98%)      Exercise Time:  7:30  Speed (mph): 6.0    Grade (%): 6.0    RPE: 18  Reason stopped: Dyspnea (8/10)  Additional symptoms: lightheaded (8/10)  Resting HR: 108 Peak HR: 181  (97% age predicted max HR)  BP rest: 136/98 BP peak: 180/98  Peak VO2: 36.2 (86% predicted peak VO2)  VE/VCO2 slope: 38  OUES: 2.61  Peak RER: 1.14  Ventilatory Threshold: 29.9 (71% predicted or measured peak VO2)  Peak RR 59  Peak Ventilation: 134.5  VE/MVV: 78%  PETCO2 at peak: 29  O2pulse: 17  (89% predicted O2pulse0   Interpretation  Notes: Patient gave an excellent effort. Pulse-oximetry gradually decreased to 93% at peak exercise. Exercise was performed on on a treadmill beginning at 3.61mph and 0% grade increasing 1.0 mph and 2.0% grade every other minute.   ECG: Resting ECG in normal sinus rhythm with right bundle branch block. HR response appropriate. There were no sustained arrhythmias and no ST-T changes. BP response appropriate.   PFT: Pre-exercise spirometry was within normal limits. The MVV was normal.   CPX: Exercise testing with gas exchange demonstrates a mildly peak VO2 of 36.2 ml/kg/min (86% of the age/gender/weight matched sedentary norms). The RER of 1.14 indicates a maximal effort. When adjusted to the patient's ideal body weight of 180.3 lb (81.8 kg) the peak VO2 is 36.7 ml/kg (ibw)/min (87% of the ibw-adjusted predicted). The VE/VCO2 slope is elevated and indicates excessive dead space ventilation. The oxygen uptake efficiency slope (OUES) is  mildly reduced but within normal range. The VO2  at the ventilatory threshold was normal at 71% of the predicted peak VO2. At peak exercise, the ventilation reached 78% of the measured MVV indicating ventilatory limits were nearly achieved. The O2pulse (a surrogate for stroke volume) increased with incremental exercise, reaching peak at 17 ml/beat (89% predicted).    Conclusion: Exercise testing with gas exchange demonstrates normal functional capacity when compared to matches sedentary norms. Patient nearly achieved ventilatory limits and began desaturations near peak exercise. Although PVO2 and aerobic capacity normal, elevated Ve/VCo2 slope and mild desaturations indicate increased pressures with circulatory limitation.   Test, report and preliminary impression by: Lesia Hausen, MS, ACSM-RCEP 02/02/2017 4:32 PM     Recent Lab Findings: Lab Results  Component Value Date   WBC 6.8 03/24/2017   HGB 15.1 03/24/2017   HCT 42.8 03/24/2017   PLT 197 03/24/2017   GLUCOSE 97 03/24/2017   ALT 22 03/24/2017   AST 16 03/24/2017   NA 134 (L) 03/24/2017   K 4.1 03/24/2017   CL 103 03/24/2017   CREATININE 1.09 03/24/2017   BUN 13 03/24/2017   CO2 21 (L) 03/24/2017   INR 1.04 03/24/2017      Assessment / Plan:   Patient with pectus excavatum with a Haller index of 3.7, significant diffusion capacity deficit on PFTs with normal volumes, depressed ejection fraction 40%. I've examined the patient and discussed with him the severity of his pectus. Repair has been recommended.  The risks and options were again discussed with the patient including proceeding with modified Ravitch procedure versus a Nuss bar.  Referral to [facility] was offered. The patient wishes to proceed with modified Ravitch The goals risks and alternatives of the planned surgical procedure Procedure(s): PECTUS EXCAVATUM RECONSTRUCTION (N/A)  have been discussed with the patient in detail. The risks of the procedure including  death, infection, stroke, myocardial infarction,recurrent defect, pneumothorax,  bleeding, blood transfusion have all been discussed specifically.  I have quoted Marcus Ellis a 2 % of perioperative mortality and a complication rate as high as 30 %. The patient's questions have been answered.Marcus Ellis is willing  to proceed with the planned procedure.  Delight Ovens MD      [address] [place] [place] [address] Office [phone]   Beeper [phone]  03/28/2017 7:01 AM

## 2017-03-29 ENCOUNTER — Encounter (HOSPITAL_COMMUNITY): Payer: Self-pay | Admitting: Cardiothoracic Surgery

## 2017-03-29 ENCOUNTER — Inpatient Hospital Stay (HOSPITAL_COMMUNITY): Payer: Commercial Managed Care - PPO

## 2017-03-29 LAB — BLOOD GAS, ARTERIAL
Acid-Base Excess: 1.3 mmol/L (ref 0.0–2.0)
BICARBONATE: 25.6 mmol/L (ref 20.0–28.0)
DRAWN BY: 36277
O2 Content: 1 L/min
O2 Saturation: 98.1 %
PATIENT TEMPERATURE: 98.6
pCO2 arterial: 42.7 mmHg (ref 32.0–48.0)
pH, Arterial: 7.396 (ref 7.350–7.450)
pO2, Arterial: 105 mmHg (ref 83.0–108.0)

## 2017-03-29 LAB — CBC
HEMATOCRIT: 38.1 % — AB (ref 39.0–52.0)
HEMOGLOBIN: 13.3 g/dL (ref 13.0–17.0)
MCH: 29.1 pg (ref 26.0–34.0)
MCHC: 34.9 g/dL (ref 30.0–36.0)
MCV: 83.4 fL (ref 78.0–100.0)
Platelets: 223 10*3/uL (ref 150–400)
RBC: 4.57 MIL/uL (ref 4.22–5.81)
RDW: 12.5 % (ref 11.5–15.5)
WBC: 10.8 10*3/uL — AB (ref 4.0–10.5)

## 2017-03-29 LAB — BASIC METABOLIC PANEL
Anion gap: 7 (ref 5–15)
BUN: 11 mg/dL (ref 6–20)
CO2: 25 mmol/L (ref 22–32)
CREATININE: 0.97 mg/dL (ref 0.61–1.24)
Calcium: 8.7 mg/dL — ABNORMAL LOW (ref 8.9–10.3)
Chloride: 103 mmol/L (ref 101–111)
GFR calc Af Amer: >60 mL/min (ref >60)
Glucose, Bld: 132 mg/dL — ABNORMAL HIGH (ref 65–99)
POTASSIUM: 3.9 mmol/L (ref 3.5–5.1)
SODIUM: 135 mmol/L (ref 135–145)

## 2017-03-29 LAB — GLUCOSE, CAPILLARY
Glucose-Capillary: 106 mg/dL — ABNORMAL HIGH (ref 65–99)
Glucose-Capillary: 109 mg/dL — ABNORMAL HIGH (ref 65–99)
Glucose-Capillary: 112 mg/dL — ABNORMAL HIGH (ref 65–99)
Glucose-Capillary: 117 mg/dL — ABNORMAL HIGH (ref 65–99)
Glucose-Capillary: 128 mg/dL — ABNORMAL HIGH (ref 65–99)
Glucose-Capillary: 136 mg/dL — ABNORMAL HIGH (ref 65–99)

## 2017-03-29 MED ORDER — ENOXAPARIN SODIUM 30 MG/0.3ML ~~LOC~~ SOLN: 30.0000 mg | SUBCUTANEOUS | Status: DC

## 2017-03-29 MED ORDER — ENOXAPARIN SODIUM 40 MG/0.4ML ~~LOC~~ SOLN
40.0000 mg | SUBCUTANEOUS | Status: DC
  Administered 2017-03-29 – 2017-04-01 (×4): 40 mg via SUBCUTANEOUS
  Filled 2017-03-29 (×4): qty 0.4

## 2017-03-29 MED ORDER — KETOROLAC TROMETHAMINE 15 MG/ML IJ SOLN
15.0000 mg | Freq: Four times a day (QID) | INTRAMUSCULAR | Status: AC | PRN
  Administered 2017-03-29 – 2017-04-01 (×9): 15 mg via INTRAVENOUS
  Filled 2017-03-29 (×9): qty 1

## 2017-03-29 MED ORDER — NICOTINE 21 MG/24HR TD PT24
21.0000 mg | MEDICATED_PATCH | Freq: Every day | TRANSDERMAL | Status: DC
  Administered 2017-03-29: 21 mg via TRANSDERMAL
  Filled 2017-03-29 (×2): qty 1

## 2017-03-29 NOTE — Progress Notes (Signed)
      [address]       [place] [id number]             [phone]      POD # 1 pectus repair  BP 126/81   Pulse 99   Temp 99.9 F (37.7 C) (Axillary)   Resp (!) 28   Wt 182 lb 12.8 oz (82.9 kg)   SpO2 94%   BMI 26.23 kg/m   Intake/Output Summary (Last 24 hours) at 03/29/2017 1807 Last data filed at 03/29/2017 1700 Gross per 24 hour  Intake 2547.5 ml  Output 3879 ml  Net -1331.5 ml   Pain control remains an issue although sleeping right now  United Technologies CorporationSteven C. Dorris FetchHendrickson, MD [facility] [phone]

## 2017-03-29 NOTE — Progress Notes (Signed)
Patient ID: Marcus Ellis, male   DOB: 10-17-1980, 36 y.o.   MRN: [id number] TCTS DAILY ICU PROGRESS NOTE                   [address]            [place] [id number]          [phone]   1 Day Post-Op Procedure(s) (LRB): REPAIR OF PECTUS EXCAVATUM WITH MODIFIED RAVITCH PROCEDURE (N/A) STERNAL PLATING  Total Length of Stay:  LOS: 1 day   Subjective: Patient up in chair with adequate pain control, good respiratory effort  Objective: Vital signs in last 24 hours: Temp:  [97.6 F (36.4 C)-98.7 F (37.1 C)] 98.5 F (36.9 C) (11/06 0408) Pulse Rate:  [63-102] 63 (11/06 0700) Cardiac Rhythm: Normal sinus rhythm (11/06 0400) Resp:  [10-25] 18 (11/06 0700) BP: (124-163)/(72-106) 130/77 (11/06 0700) SpO2:  [95 %-99 %] 97 % (11/06 0700) Arterial Line BP: (132-191)/(58-91) 145/58 (11/06 0700) Weight:  [182 lb 12.8 oz (82.9 kg)] 182 lb 12.8 oz (82.9 kg) (11/06 0500)  Filed Weights   03/28/17 0555 03/29/17 0500  Weight: 183 lb (83 kg) 182 lb 12.8 oz (82.9 kg)    Weight change: -3.2 oz (-0.091 kg)   Hemodynamic parameters for last 24 hours:    Intake/Output from previous day: 11/05 0701 - 11/06 0700 In: 4396.7 [I.V.:4196.7; IV Piggyback:200] Out: 4598 [Urine:3725; Drains:373; Blood:500]  Intake/Output this shift: No intake/output data recorded.  Current Meds: Scheduled Meds: . acetaminophen  1,000 mg Oral Q6H   Or  . acetaminophen (TYLENOL) oral liquid 160 mg/5 mL  1,000 mg Oral Q6H  . amphetamine-dextroamphetamine  20 mg Oral Daily  . bisacodyl  10 mg Oral Daily  . fentaNYL   Intravenous Q4H  . insulin aspart  0-24 Units Subcutaneous Q4H  . mouth rinse  15 mL Mouth Rinse BID  . senna-docusate  1 tablet Oral QHS   Continuous Infusions: . dextrose 5 % and 0.45% NaCl 100 mL/hr at 03/28/17 2122  . ketamine (KETALAR) IV infusion 2mg /mL    . potassium chloride     PRN Meds:.diphenhydrAMINE **OR** diphenhydrAMINE, fentaNYL (SUBLIMAZE) injection,  hydrALAZINE, naloxone **AND** sodium chloride flush, ondansetron (ZOFRAN) IV, oxyCODONE, potassium chloride, traMADol  General appearance: alert and cooperative Neurologic: intact Heart: regular rate and rhythm, S1, S2 normal, no murmur, click, rub or gallop Lungs: diminished breath sounds bibasilar Abdomen: soft, non-tender; bowel sounds normal; no masses,  no organomegaly Extremities: extremities normal, atraumatic, no cyanosis or edema and Homans sign is negative, no sign of DVT Wound: Dressings intact, 2 JP drains are functioning properly  Lab Results: CBC: Recent Labs    03/29/17 0412  WBC 10.8*  HGB 13.3  HCT 38.1*  PLT 223   BMET:  Recent Labs    03/29/17 0412  NA 135  K 3.9  CL 103  CO2 25  GLUCOSE 132*  BUN 11  CREATININE 0.97  CALCIUM 8.7*    CMET: Lab Results  Component Value Date   WBC 10.8 (H) 03/29/2017   HGB 13.3 03/29/2017   HCT 38.1 (L) 03/29/2017   PLT 223 03/29/2017   GLUCOSE 132 (H) 03/29/2017   ALT 22 03/24/2017   AST 16 03/24/2017   NA 135 03/29/2017   K 3.9 03/29/2017   CL 103 03/29/2017   CREATININE 0.97 03/29/2017   BUN 11 03/29/2017   CO2 25 03/29/2017   INR 1.04 03/24/2017      PT/INR:  No results for input(s): LABPROT, INR in the last 72 hours. Radiology: Dg Chest Port 1 View  Result Date: 03/28/2017 CLINICAL DATA:  Post pectus excavatum correction. EXAM: PORTABLE CHEST 1 VIEW COMPARISON:  03/28/2017 earlier. FINDINGS: Lungs are adequately inflated without focal airspace consolidation or effusion. No pneumothorax. Cardiomediastinal silhouette is within normal. Fixation plate and screws vertically over the midline of the mid to lower thorax and also paralleling the anterior right fifth rib. IMPRESSION: No acute cardiopulmonary disease. Postsurgical change of lower thorax. Electronically Signed   By: Elberta Fortisaniel  Boyle M.D.   On: 03/28/2017 14:46   Chest x-ray this morning not read but appears similar to yesterday, no pleural effusions or  pneumothorax  Assessment/Plan: S/P Procedure(s) (LRB): REPAIR OF PECTUS EXCAVATUM WITH MODIFIED RAVITCH PROCEDURE (N/A) STERNAL PLATING Mobilize  D/c aline and foley  Expected Acute  Blood - loss Anemia- continue to monitor      Delight Ovensdward B Julliana Whitmyer 03/29/2017 7:21 AM

## 2017-03-30 ENCOUNTER — Other Ambulatory Visit: Payer: Self-pay

## 2017-03-30 ENCOUNTER — Inpatient Hospital Stay (HOSPITAL_COMMUNITY): Payer: Commercial Managed Care - PPO

## 2017-03-30 LAB — COMPREHENSIVE METABOLIC PANEL
ALBUMIN: 3 g/dL — AB (ref 3.5–5.0)
ALK PHOS: 40 U/L (ref 38–126)
ALT: 18 U/L (ref 17–63)
AST: 23 U/L (ref 15–41)
Anion gap: 6 (ref 5–15)
BILIRUBIN TOTAL: 1.1 mg/dL (ref 0.3–1.2)
BUN: 12 mg/dL (ref 6–20)
CALCIUM: 8.3 mg/dL — AB (ref 8.9–10.3)
CO2: 26 mmol/L (ref 22–32)
Chloride: 99 mmol/L — ABNORMAL LOW (ref 101–111)
Creatinine, Ser: 0.92 mg/dL (ref 0.61–1.24)
GFR calc Af Amer: >60 mL/min (ref >60)
GFR calc non Af Amer: >60 mL/min (ref >60)
GLUCOSE: 116 mg/dL — AB (ref 65–99)
Potassium: 3.3 mmol/L — ABNORMAL LOW (ref 3.5–5.1)
SODIUM: 131 mmol/L — AB (ref 135–145)
TOTAL PROTEIN: 5.5 g/dL — AB (ref 6.5–8.1)

## 2017-03-30 LAB — CBC
HEMATOCRIT: 35.6 % — AB (ref 39.0–52.0)
HEMOGLOBIN: 12 g/dL — AB (ref 13.0–17.0)
MCH: 28.6 pg (ref 26.0–34.0)
MCHC: 33.7 g/dL (ref 30.0–36.0)
MCV: 84.8 fL (ref 78.0–100.0)
Platelets: 159 10*3/uL (ref 150–400)
RBC: 4.2 MIL/uL — AB (ref 4.22–5.81)
RDW: 12.5 % (ref 11.5–15.5)
WBC: 9.5 10*3/uL (ref 4.0–10.5)

## 2017-03-30 LAB — GLUCOSE, CAPILLARY: Glucose-Capillary: 112 mg/dL — ABNORMAL HIGH (ref 65–99)

## 2017-03-30 MED ORDER — POTASSIUM CHLORIDE CRYS ER 20 MEQ PO TBCR
40.0000 meq | EXTENDED_RELEASE_TABLET | Freq: Once | ORAL | Status: AC
  Administered 2017-03-30: 40 meq via ORAL
  Filled 2017-03-30: qty 2

## 2017-03-30 MED ORDER — LEVALBUTEROL HCL 0.63 MG/3ML IN NEBU: 0.6300 mg | INHALATION_SOLUTION | Freq: Four times a day (QID) | RESPIRATORY_TRACT | Status: DC | PRN

## 2017-03-30 MED ORDER — POTASSIUM CHLORIDE CRYS ER 20 MEQ PO TBCR
30.0000 meq | EXTENDED_RELEASE_TABLET | Freq: Once | ORAL | Status: AC
  Administered 2017-03-30: 10:00:00 30 meq via ORAL
  Filled 2017-03-30: qty 1

## 2017-03-30 NOTE — Progress Notes (Addendum)
TCTS DAILY ICU PROGRESS NOTE                   [address]            [place] [id number]          [phone]   2 Days Post-Op Procedure(s) (LRB): REPAIR OF PECTUS EXCAVATUM WITH MODIFIED RAVITCH PROCEDURE (N/A) STERNAL PLATING  Total Length of Stay:  LOS: 2 days   Subjective: Patient states better controlled this am. Has occasional nausea but states he feels better now that he is eating.  Objective: Vital signs in last 24 hours: Temp:  [98 F (36.7 C)-99.9 F (37.7 C)] 98.6 F (37 C) (11/07 0733) Pulse Rate:  [76-106] 93 (11/07 0708) Cardiac Rhythm: Normal sinus rhythm (11/06 0800) Resp:  [11-33] 17 (11/07 0742) BP: (108-144)/(56-96) 132/78 (11/07 0708) SpO2:  [92 %-98 %] 96 % (11/07 0742)  Filed Weights   03/28/17 0555 03/29/17 0500  Weight: 183 lb (83 kg) 182 lb 12.8 oz (82.9 kg)      Intake/Output from previous day: 11/06 0701 - 11/07 0700 In: 2527.5 [P.O.:1080; I.V.:1447.5] Out: 2581 [Urine:2275; Drains:306]  Intake/Output this shift: No intake/output data recorded.  Current Meds: Scheduled Meds: . acetaminophen  1,000 mg Oral Q6H   Or  . acetaminophen (TYLENOL) oral liquid 160 mg/5 mL  1,000 mg Oral Q6H  . amphetamine-dextroamphetamine  20 mg Oral Daily  . bisacodyl  10 mg Oral Daily  . enoxaparin (LOVENOX) injection  40 mg Subcutaneous Q24H  . fentaNYL   Intravenous Q4H  . mouth rinse  15 mL Mouth Rinse BID  . nicotine  21 mg Transdermal Daily  . senna-docusate  1 tablet Oral QHS   Continuous Infusions: . dextrose 5 % and 0.45% NaCl 50 mL/hr at 03/29/17 1800  . potassium chloride     PRN Meds:.diphenhydrAMINE **OR** diphenhydrAMINE, fentaNYL (SUBLIMAZE) injection, hydrALAZINE, ketorolac, naloxone **AND** sodium chloride flush, ondansetron (ZOFRAN) IV, oxyCODONE, potassium chloride, traMADol  General appearance: alert, cooperative and no distress Neurologic: intact Heart: RRR Lungs: clear to auscultation bilaterally Abdomen:  Soft, non tender, bowel sounds present Extremities: No LE edema Wound: Dressings are clean and dry;will remove in am  Lab Results: CBC: Recent Labs    03/29/17 0412 03/30/17 0449  WBC 10.8* 9.5  HGB 13.3 12.0*  HCT 38.1* 35.6*  PLT 223 159   BMET:  Recent Labs    03/29/17 0412 03/30/17 0449  NA 135 131*  K 3.9 3.3*  CL 103 99*  CO2 25 26  GLUCOSE 132* 116*  BUN 11 12  CREATININE 0.97 0.92  CALCIUM 8.7* 8.3*    CMET: Lab Results  Component Value Date   WBC 9.5 03/30/2017   HGB 12.0 (L) 03/30/2017   HCT 35.6 (L) 03/30/2017   PLT 159 03/30/2017   GLUCOSE 116 (H) 03/30/2017   ALT 18 03/30/2017   AST 23 03/30/2017   NA 131 (L) 03/30/2017   K 3.3 (L) 03/30/2017   CL 99 (L) 03/30/2017   CREATININE 0.92 03/30/2017   BUN 12 03/30/2017   CO2 26 03/30/2017   INR 1.04 03/24/2017    PT/INR: No results for input(s): LABPROT, INR in the last 72 hours. Radiology: No results found.   Assessment/Plan: S/P Procedure(s) (LRB): REPAIR OF PECTUS EXCAVATUM WITH MODIFIED RAVITCH PROCEDURE (N/A) STERNAL PLATING  1. CV-SR in the 90's this am. 2. Pulmonary-On room air. CXR this am appears stable (shows patient is rotated to the right, no  pneumothorax). JP drains with 306 cc last 24 hours.Encourage incentive spirometer. 3. ABL anemia-H and H decreased to 12 and 35.6. 4. Supplement potassium 5. Hope to transfer to 4E-will write transfer orders as discussed with Dr. Cristie HemGerhardt  ZIMMERMAN,DONIELLE M PA-C 03/30/2017 7:51 AM    Better pain control today  I have seen and examined Marcus Ellis and agree with the above assessment  and plan.  Delight OvensEdward B Ignatius Kloos MD Beeper [phone] Office [phone] 03/30/2017 6:04 PM

## 2017-03-31 ENCOUNTER — Inpatient Hospital Stay (HOSPITAL_COMMUNITY): Payer: Commercial Managed Care - PPO

## 2017-03-31 LAB — CBC
HCT: 33.1 % — ABNORMAL LOW (ref 39.0–52.0)
Hemoglobin: 11.2 g/dL — ABNORMAL LOW (ref 13.0–17.0)
MCH: 28.9 pg (ref 26.0–34.0)
MCHC: 33.8 g/dL (ref 30.0–36.0)
MCV: 85.5 fL (ref 78.0–100.0)
Platelets: 178 10*3/uL (ref 150–400)
RBC: 3.87 MIL/uL — AB (ref 4.22–5.81)
RDW: 12.7 % (ref 11.5–15.5)
WBC: 6.8 10*3/uL (ref 4.0–10.5)

## 2017-03-31 MED ORDER — LACTULOSE 10 GM/15ML PO SOLN
20.0000 g | Freq: Every day | ORAL | Status: DC | PRN
  Administered 2017-04-01: 20 g via ORAL
  Filled 2017-03-31: qty 30

## 2017-03-31 NOTE — Plan of Care (Signed)
  Clinical Measurements: Ability to maintain clinical measurements within normal limits will improve 03/31/2017 0024 - Progressing by Ruel FavorsBrown-Staples, Jocee Kissick, RN Will remain free from infection 03/31/2017 0024 - Progressing by Ruel FavorsBrown-Staples, Kestrel Mis, RN Diagnostic test results will improve 03/31/2017 0024 - Progressing by Ruel FavorsBrown-Staples, Torryn Hudspeth, RN Respiratory complications will improve 03/31/2017 0024 - Progressing by Ruel FavorsBrown-Staples, Akiyah Eppolito, RN Cardiovascular complication will be avoided 03/31/2017 0024 - Progressing by Ruel FavorsBrown-Staples, Jessalyn Hinojosa, RN   Activity: Risk for activity intolerance will decrease 03/31/2017 0024 - Progressing by Ruel FavorsBrown-Staples, Zeina Akkerman, RN   Coping: Level of anxiety will decrease 03/31/2017 0024 - Progressing by Ruel FavorsBrown-Staples, Vallorie Niccoli, RN

## 2017-03-31 NOTE — Care Management Note (Signed)
Case Management Note Donn PieriniKristi Joseph Bias RN, BSN Unit 4E-Case Manager [phone]  Patient Details  Name: Marcus SohoMichael J Holdren MRN: [id number] Date of Birth: [birth year]  Subjective/Objective:  Pt admitted s/p REPAIR OF PECTUS EXCAVATUM WITH MODIFIED RAVITCH PROCEDURE --STERNAL PLATING                 Action/Plan: PTA pt lived at home with spouse- anticipate return home- CM to follow  Expected Discharge Date:                  Expected Discharge Plan:  Home/Self Care  In-House Referral:     Discharge planning Services  CM Consult  Post Acute Care Choice:    Choice offered to:     DME Arranged:    DME Agency:     HH Arranged:    HH Agency:     Status of Service:  In process, will continue to follow  If discussed at Long Length of Stay Meetings, dates discussed:    Discharge Disposition:   Additional Comments:  Darrold SpanWebster, Aaryana Betke Hall, RN 03/31/2017, 11:36 AM

## 2017-03-31 NOTE — Plan of Care (Signed)
Patient is compliant with plan of care. Main goal tonight is pain management.

## 2017-03-31 NOTE — Discharge Instructions (Signed)
Activity: 1.May walk up steps                2.No lifting more than ten pounds until seen in follow up appointment.                3.No driving  until seen in follow up appointment.                4.Stop any activity that causes chest pain, shortness of breath, dizziness, sweating or excessive weakness.                5.Continue with your breathing exercises daily.  Diet: Regular diet  Wound Care: May shower.  Clean wounds with mild soap and water daily. Contact the office at [phone] if any problems arise.

## 2017-03-31 NOTE — Progress Notes (Addendum)
      [address]       [place] [id number]             [phone]       3 Days Post-Op Procedure(s) (LRB): REPAIR OF PECTUS EXCAVATUM WITH MODIFIED RAVITCH PROCEDURE (N/A) STERNAL PLATING  Subjective: Patient with incisional pain.  Objective: Vital signs in last 24 hours: Temp:  [97.9 F (36.6 C)-98.9 F (37.2 C)] 98.5 F (36.9 C) (11/08 0428) Pulse Rate:  [87-117] 107 (11/08 0428) Cardiac Rhythm: Sinus tachycardia;Bundle branch block (11/08 0428) Resp:  [13-35] 13 (11/08 0428) BP: (126-153)/(76-88) 133/76 (11/08 0428) SpO2:  [93 %-100 %] 94 % (11/08 0428) Weight:  [188 lb 7.9 oz (85.5 kg)] 188 lb 7.9 oz (85.5 kg) (11/08 0428)     Intake/Output from previous day: 11/07 0701 - 11/08 0700 In: 1395 [P.O.:480; I.V.:895] Out: 3300 [Urine:3200; Drains:100]   Physical Exam:  Cardiovascular: RRR Pulmonary: Clear to auscultation bilaterally Abdomen: Soft, non tender, bowel sounds present. Extremities: No lower extremity edema. Wounds: Aquacel removed and wound is clean and dry.  No erythema or signs of infection. JP drains: Light bloody output  Lab Results: CBC: Recent Labs    03/30/17 0449 03/31/17 0410  WBC 9.5 6.8  HGB 12.0* 11.2*  HCT 35.6* 33.1*  PLT 159 178   BMET:  Recent Labs    03/29/17 0412 03/30/17 0449  NA 135 131*  K 3.9 3.3*  CL 103 99*  CO2 25 26  GLUCOSE 132* 116*  BUN 11 12  CREATININE 0.97 0.92  CALCIUM 8.7* 8.3*    PT/INR: No results for input(s): LABPROT, INR in the last 72 hours. ABG:  INR: Will add last result for INR, ABG once components are confirmed Will add last 4 CBG results once components are confirmed  Assessment/Plan:  1. CV - SR in the 90's. 2.  Pulmonary - JP drains with 100 cc of output last 24 hours. JP utput is decreasing. Hope to remove 1 chest tube soon. On 1 liter of oxygen via [place]. Wean to room air as tolerates. CXR appears stable (no pneumothorax). Encourage incentive spirometer and  flutter valve 3. Probably home 1-2 days  ZIMMERMAN,DONIELLE MPA-C 03/31/2017,7:10 AM    We will leave JP drains in place for today Patient notes that he is not using the PCA pump much will DC continue with oral agents , No bowel movement since surgery will give lactulose today has been on stool softeners I have seen and examined Jarrett SohoMichael J Brazie and agree with the above assessment  and plan.  Delight OvensEdward B Zavannah Deblois MD Beeper [phone] Office [phone] 03/31/2017 9:02 AM

## 2017-03-31 NOTE — Discharge Summary (Signed)
Physician Discharge Summary       [address]       [place] [id number]             [phone]    Patient ID: Marcus Ellis MRN: [id number] DOB/AGE: 1980/12/09 36 y.o.  Admit date: 03/28/2017 Discharge date: 04/02/2017  Admission Diagnosis: Pectus excavatum  Active Diagnoses:  1. Tobacco abuse (quit 06') 2. ADD (attention deficit disorder) 3. GERD (gastroesophageal reflux disease) 4. RBBB (right bundle branch block) 5. Palpitations    Procedure (s):  REPAIR OF PECTUS EXCAVATUM WITH MODIFIED RAVITCH PROCEDURE and STERNAL PLATING by Dr. Tyrone SageGerhardt on 03/28/2017.  History of Presenting Illness: This is a 36 y.o. male who has been followed in the office for consideration of pectus repair . He was originally seen for evaluation of recent history of palpitations and exertional dyspnea. The patient has a history of known pectus excavatum and has been  evaluated multiple times in the past including in 2008 well he was in the [employer]. He notes his pulmonary function studies were normal at that time , He provided copies today. The patient works as a Engineer, civil (consulting)nurse . EKG on himself and noticed a right bundle branch block, he did this because he noted palpitations and increasing shortness of breath with exertion. Cardiology evaluation was performed including echocardiogram which showed a decreased ejection fraction 40-50%. CT scan of the chest was performed demonstrating a Haller index of 3.7. Stress test was negative for ischemia. PFTs were done demonstrating normal volumes and significantly decreased diffusion capacity. The patient is referred for consideration of pectus repair.  Patient with pectus excavatum with a Haller index of 3.7, significant diffusion capacity deficit on PFTs with normal volumes, depressed ejection fraction 40%. I've examined the patient and discussed with him the severity of his pectus.  The risks and options were again discussed with the patient including  proceeding with modified Ravitch procedure versus a Nuss bar.  [facility]. The patient wishes to proceed with modified Ravitch. He was admitted on 03/28/2017 in order to undergo the preceding surgery.  Brief Hospital Course:  The patient remained afebrile and hemodynamically stable. A line and foley were removed early in the post operative course. He had 2 JP drains and output gradually decreased. JP drains were removed 04/01/2017.  His right JP drain will remain in place Daily chest x rays were obtained and remained stable.Patient is ambulating on room air. Patient is tolerating a diet and has had a bowel movement. Wounds are clean and dry. Final chest X ray showed slightly improved lung volumes, no pneumothorax, and bibasilar atelectasis. Patient is felt surgically stable for discharge today.   Latest Vital Signs: Blood pressure (!) 140/95, pulse 84, temperature 98.4 F (36.9 C), temperature source Oral, resp. rate 13, height 5\' 10"  (1.778 m), weight 187 lb 4.8 oz (85 kg), SpO2 95 %.  Physical Exam: Cardiovascular: RRR Pulmonary: Clear to auscultation bilaterally Abdomen: Soft, non tender, bowel sounds present. Extremities: No lower extremity edema. Wounds: Aquacel removed and wound is clean and dry.  No erythema or signs of infection.  Discharge Condition: Stable and discharged to home.  Recent laboratory studies:  Lab Results  Component Value Date   WBC 6.8 03/31/2017   HGB 11.2 (L) 03/31/2017   HCT 33.1 (L) 03/31/2017   MCV 85.5 03/31/2017   PLT 178 03/31/2017   Lab Results  Component Value Date   NA 131 (L) 03/30/2017   K 3.3 (L) 03/30/2017  CL 99 (L) 03/30/2017   CO2 26 03/30/2017   CREATININE 0.92 03/30/2017   GLUCOSE 116 (H) 03/30/2017    Diagnostic Studies: Dg Chest 2 View  Result Date: 03/31/2017 CLINICAL DATA:  Status post pectus excavatum repair and rib plating. EXAM: CHEST  2 VIEW COMPARISON:  03/30/2017 FINDINGS: The cardiomediastinal  silhouette is unchanged. Plate and screw fixation of the sternum and anterior right fifth rib noted. Bibasilar atelectasis again noted, right greater than left. There is no evidence of pneumothorax. There has been little interval change since prior study except for slightly improved lung volumes. IMPRESSION: Slightly improved lung volumes without other significant change. No evidence of pneumothorax. Continued bilateral lower lung atelectasis. Electronically Signed   By: Harmon PierJeffrey  Hu M.D.   On: 03/31/2017 08:50   Discharge Medications: Allergies as of 04/02/2017      Reactions   Bee Venom Hives   Penicillins Other (See Comments)   UNSPECIFIED REACTION AS A CHILD Has patient had a PCN reaction causing immediate rash, facial/tongue/throat swelling, SOB or lightheadedness with hypotension: Unknown Has patient had a PCN reaction causing severe rash involving mucus membranes or skin necrosis: Unknown Has patient had a PCN reaction that required hospitalization: Unknown Has patient had a PCN reaction occurring within the last 10 years: No If all of the above answers are "NO", then may proceed with Cephalosporin use.      Medication List    TAKE these medications   amphetamine-dextroamphetamine 20 MG tablet Commonly known as:  ADDERALL Take 20 mg by mouth daily.   calcium carbonate 500 MG chewable tablet Commonly known as:  TUMS - dosed in mg elemental calcium Chew 2 tablets by mouth 2 (two) times daily as needed for indigestion or heartburn.   ibuprofen 800 MG tablet Commonly known as:  ADVIL,MOTRIN Take 800 mg by mouth every 8 (eight) hours as needed for mild pain.   nicotine 21 mg/24hr patch Commonly known as:  NICODERM CQ - dosed in mg/24 hours Place 1 patch (21 mg total) daily onto the skin.   oxyCODONE 5 MG immediate release tablet Commonly known as:  Oxy IR/ROXICODONE Take 5 mg by mouth every 4-6 hours PRN severe pain   sodium-potassium bicarbonate Tbef dissolvable  tablet Commonly known as:  ALKA-SELTZER GOLD Take 1 tablet by mouth daily as needed (indigestion).       Follow Up Appointments: Follow-up Information    [facility] [place] Follow up on 04/11/2017.   Specialty:  Cardiothoracic Surgery Why:  Appointment is at 3:00, please get CX[facility] at 2:30 at [facility] located on first floor of our office building Contact information: [address] [place] [id number] [phone]       Selinda FlavinHoward, Kevin, MD Follow up.   Specialty:  Family Medicine Contact information: [address] [place] [address] [phone]           Signed: Marrion CoyBARRETT, ERINPA-C 04/02/2017, 8:42 AM

## 2017-04-01 MED ORDER — ONDANSETRON HCL 4 MG/2ML IJ SOLN: 4.0000 mg | Freq: Four times a day (QID) | INTRAMUSCULAR | Status: DC | PRN

## 2017-04-01 MED ORDER — ACETAMINOPHEN 325 MG PO TABS: 650.0000 mg | ORAL_TABLET | ORAL | Status: DC | PRN

## 2017-04-01 MED ORDER — OXYCODONE HCL 5 MG PO TABS: ORAL_TABLET | ORAL | 0 refills | Status: DC

## 2017-04-01 MED ORDER — ALUM & MAG HYDROXIDE-SIMETH 200-200-20 MG/5ML PO SUSP
30.0000 mL | ORAL | Status: DC | PRN
  Administered 2017-04-01: 30 mL via ORAL
  Filled 2017-04-01: qty 30

## 2017-04-01 MED ORDER — NICOTINE 21 MG/24HR TD PT24: 21.0000 mg | MEDICATED_PATCH | Freq: Every day | TRANSDERMAL | 0 refills | Status: DC

## 2017-04-01 NOTE — Progress Notes (Signed)
Pt walked this am approx 940 ft. Independent, room air. Tolerated well

## 2017-04-01 NOTE — Progress Notes (Addendum)
      [address]       [place] [id number]             [phone]       4 Days Post-Op Procedure(s) (LRB): REPAIR OF PECTUS EXCAVATUM WITH MODIFIED RAVITCH PROCEDURE (N/A) STERNAL PLATING  Subjective: Patient has already walked this am-900+ feet this am. He states left JP drain (patient's left) is clotted and not draining much but has been draining around suture. He is passing flatus and has been given Lactulose this am.  Objective: Vital signs in last 24 hours: Temp:  [98.1 F (36.7 C)-98.7 F (37.1 C)] 98.3 F (36.8 C) (11/09 0448) Pulse Rate:  [82-106] 106 (11/09 0448) Cardiac Rhythm: Sinus tachycardia (11/08 2030) Resp:  [16-18] 16 (11/09 0448) BP: (101-139)/(59-97) 101/59 (11/09 0448) SpO2:  [94 %-99 %] 99 % (11/09 0448) Weight:  [187 lb 4.8 oz (85 kg)] 187 lb 4.8 oz (85 kg) (11/09 0501)     Intake/Output from previous day: 11/08 0701 - 11/09 0700 In: 763.3 [P.O.:360; I.V.:363.3] Out: 3661 [Urine:3475; Drains:186]   Physical Exam:  Cardiovascular: RRR Pulmonary: Clear to auscultation bilaterally Abdomen: Soft, non tender, bowel sounds present. Extremities: No lower extremity edema. Wounds: Slight erythema (likely suture reaction) middle of wound. JP drains: Bloody output  Lab Results: CBC: Recent Labs    03/30/17 0449 03/31/17 0410  WBC 9.5 6.8  HGB 12.0* 11.2*  HCT 35.6* 33.1*  PLT 159 178   BMET:  Recent Labs    03/30/17 0449  NA 131*  K 3.3*  CL 99*  CO2 26  GLUCOSE 116*  BUN 12  CREATININE 0.92  CALCIUM 8.3*    PT/INR: No results for input(s): LABPROT, INR in the last 72 hours. ABG:  INR: Will add last result for INR, ABG once components are confirmed Will add last 4 CBG results once components are confirmed  Assessment/Plan:  1. CV - SR in the 90's. 2.  Pulmonary - JP drains with about 80 cc of output last 12 hours (about 185 last 24 hours). Will discuss if remove left JP drain (patient's left) and keep  right. On room air.  Encourage incentive spirometer and flutter valve   ZIMMERMAN,DONIELLE MPA-C 04/01/2017,6:45 AM   Will plan removal  of left drain today today , monitor overnight and poss d/c home tomorrow with right drain in place.  Wound examined and healing well

## 2017-04-01 NOTE — Plan of Care (Signed)
  Activity: Risk for activity intolerance will decrease 04/01/2017 2239 - Completed/Met by Jaymes Graff, RN Pt has been ambulating many times throughout the day independently, tolerating well

## 2017-04-02 NOTE — Progress Notes (Signed)
Order received to discharge patient.  Telemetry monitor removed and CCMD notified.  PIV access removed.  Discharge instructions, follow up, medications and instructions for their use discussed with patient. 

## 2017-04-02 NOTE — Progress Notes (Signed)
      [address]       [place] [id number]             [phone]      5 Days Post-Op Procedure(s) (LRB): REPAIR OF PECTUS EXCAVATUM WITH MODIFIED RAVITCH PROCEDURE (N/A) STERNAL PLATING   Subjective:  [name] is doing well.  He states he was able to take a shower yesterday and notices he is a little more uncomfortable this morning.  He wants to go home.  + ambulation   + BM  Objective: Vital signs in last 24 hours: Temp:  [98 F (36.7 C)-98.8 F (37.1 C)] 98.4 F (36.9 C) (11/10 0500) Pulse Rate:  [74-90] 84 (11/10 0500) Cardiac Rhythm: Normal sinus rhythm (11/10 0701) Resp:  [13-21] 13 (11/10 0500) BP: (134-154)/(92-100) 140/95 (11/10 0500) SpO2:  [95 %-100 %] 95 % (11/10 0500)  Intake/Output from previous day: 11/09 0701 - 11/10 0700 In: 720 [P.O.:720] Out: 1755 [Urine:1675; Drains:80] Intake/Output this shift: Total I/O In: -  Out: 40 [Drains:40]  General appearance: alert, cooperative and no distress Heart: regular rate and rhythm Lungs: clear to auscultation bilaterally Abdomen: soft, non-tender; bowel sounds normal; no masses,  no organomegaly Extremities: extremities normal, atraumatic, no cyanosis or edema Wound: clean and dry, some ecchymosis present along incision  Lab Results: Recent Labs    03/31/17 0410  WBC 6.8  HGB 11.2*  HCT 33.1*  PLT 178   BMET: No results for input(s): NA, K, CL, CO2, GLUCOSE, BUN, CREATININE, CALCIUM in the last 72 hours.  PT/INR: No results for input(s): LABPROT, INR in the last 72 hours. ABG    Component Value Date/Time   PHART 7.396 03/29/2017 0406   HCO3 25.6 03/29/2017 0406   O2SAT 98.1 03/29/2017 0406   CBG (last 3)  No results for input(s): GLUCAP in the last 72 hours.  Assessment/Plan: S/P Procedure(s) (LRB): REPAIR OF PECTUS EXCAVATUM WITH MODIFIED RAVITCH PROCEDURE (N/A) STERNAL PLATING  1. CV- NSR, BP mildly elevated- will not start medication at this time, patient will follow  up with PCP 2. Pulm- left JP drain removed yesterday, right will remain in place at discharge, no residual hematoma present 3. Dispo- patient stable, will d/c home today   LOS: 5 days    Raife Lizer 04/02/2017

## 2017-04-02 NOTE — Progress Notes (Signed)
Pt's scheduled Adderall pulled for administration.  Pt refused med.  Med not scanned.  Med returned to main pharmacy in person at request of pharmacist.

## 2017-04-06 ENCOUNTER — Ambulatory Visit (INDEPENDENT_AMBULATORY_CARE_PROVIDER_SITE_OTHER): Payer: Self-pay | Admitting: Cardiothoracic Surgery

## 2017-04-06 ENCOUNTER — Other Ambulatory Visit: Payer: Self-pay

## 2017-04-06 ENCOUNTER — Encounter: Payer: Self-pay | Admitting: Cardiothoracic Surgery

## 2017-04-06 ENCOUNTER — Other Ambulatory Visit: Payer: Self-pay | Admitting: *Deleted

## 2017-04-06 ENCOUNTER — Ambulatory Visit
Admission: RE | Admit: 2017-04-06 | Discharge: 2017-04-06 | Disposition: A | Discharge status: Home / Self Care | Payer: Commercial Managed Care - PPO | Source: Ambulatory Visit | Attending: Cardiothoracic Surgery | Admitting: Cardiothoracic Surgery

## 2017-04-06 VITALS — BP 141/101 | HR 99 | Ht 70.0 in | Wt 180.0 lb

## 2017-04-06 DIAGNOSIS — Q676 Pectus excavatum: Secondary | ICD-10-CM

## 2017-04-06 DIAGNOSIS — G8918 Other acute postprocedural pain: Secondary | ICD-10-CM

## 2017-04-06 MED ORDER — TRAMADOL HCL 50 MG PO TABS: 50.0000 mg | ORAL_TABLET | Freq: Four times a day (QID) | ORAL | 0 refills | Status: DC | PRN
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
## 2017-04-09 NOTE — Op Note (Signed)
[place]:  [place], [place]             ACCOUNT NO.:  [id number]  MEDICAL RECORD NO.:  [id number]  LOCATION:  [facility]                         FACILITY:  [facility]  PHYSICIAN:  Sheliah PlaneEdward Shakura Cowing, MD    DATE OF BIRTH:  [birth year]  DATE OF PROCEDURE:  03/28/2017 DATE OF DISCHARGE:                              OPERATIVE REPORT   PREOPERATIVE DIAGNOSIS:  Severe pectus excavatum.  POSTOPERATIVE DIAGNOSIS:  Severe pectus excavatum.  PROCEDURE:  Surgical repair of pectus excavatum by modified Ravitch technique with sternal plating.  SURGEON:  Sheliah PlaneEdward Amed Datta, MD.  FIRST ASSISTANT:  Dr. Salvatore DecentSteven C. Dorris FetchHendrickson, MD.  SECOND ASSISTANT:  Doree Fudgeonielle Zimmerman, PA.  BRIEF HISTORY:  The patient is a 36 year old male, who since adolescence has had a known marked pectus deformity.  Recently, he had an EKG that showed a bundle-branch block and was evaluated by Cardiology including echocardiogram, which demonstrated decreased ejection fraction of 40%. Further evaluation with pulmonary function studies and CPX testing was performed.  The patient had a CT scan of the chest with a calculated Haller index of 3.7.  Because of the severity of his pectus deformity and no evidence of hemodynamic changes based on echocardiogram, it was recommended to the patient that we proceed with pectus repair.  Options from repair were reviewed with the patient in detail and with his age and rigidity of the chest wall, we elected to proceed with modified Ravitch repair.  The patient was agreeable and signed informed consent.  DESCRIPTION OF PROCEDURE:  The patient underwent general endotracheal anesthesia without incident.  The chest was prepped with Betadine and draped in usual sterile manner.  Appropriate time-out was performed and then we proceeded with a curvilinear incision halfway between the costal margins and the nipple on each side curving up slightly in the midline. This was carried down through the subcutaneous tissue and  skin flaps were raised superiorly and inferiorly exposing the pectoralis muscle. We then divided pectoralis muscle attachments along the midline and developed muscle flap toward the right and left to gain exposure to the sternum.  The lower costochondral junctions were exposed.  The limited part of the rectus was attached from the xiphoid and the xiphoid was removed.  We then proceeded in a methodical manner excising segments of cartilage from the 4th rib inferiorly starting on the right side.  Each cartilage was removed in a manner to preserve the perichondrium dividing the anterior portion using a Freer elevator to elevate and divide it. This was done both on the right and left sides.  With this completed, we then worked toward elevating the sternum.  The patient's defect was primarily toward the right with both depression and rotation of the sternum.  Substernal space was freed up to elevate the sternum.  To broad adequate elevation, we then performed an osteotomy in the upper portion of the sternum at approximately third intercostal space.  The posterior sternum was preserved and the anterior table was divided in a wedge manner to allow hinging of the sternum upward.  We then used KLM sternal fixation bars with an approximately 25-degree bend and attached them to manubrium and along the sternum in a vertical manner with 9 and 11 mm  self tapping screws in an area with the most depression and rotation along the right lower side.  A T-shaped sternal bar was then attached to the sternum into the 5th rib to add further support to prevent rotation.  With this completed, we were satisfied with the outcome and the position of the sternum.  We then proceeded with closing the wound.  Two flat drains were placed, one under the pectoralis muscle brought out to the left and one under the skin flaps brought to the right.  Rectus muscles were reattached to the lower rib junction and sternum with  interrupted 0 Vicryl.  The pectoralis muscle was then reattached along the midline.  The incision was then closed with a running 2-0 Vicryl in the subcutaneous tissue and a 3-0 subcuticular stitch in the skin edges.  Dry dressings were applied. Estimated blood loss was 150 mL.  Sponge and needle count was reported as correct.  The patient was awakened and extubated in the operating room and transferred to the recovery room for further postoperative care, having tolerated the procedure without obvious complication.     Sheliah PlaneEdward Lowen Barringer, MD     EG/MEDQ  D:  04/08/2017  T:  04/09/2017  Job:  [id number]

## 2017-04-11 ENCOUNTER — Other Ambulatory Visit: Payer: Self-pay

## 2017-04-11 ENCOUNTER — Ambulatory Visit (INDEPENDENT_AMBULATORY_CARE_PROVIDER_SITE_OTHER): Payer: Self-pay | Admitting: Physician Assistant

## 2017-04-11 ENCOUNTER — Ambulatory Visit: Payer: Commercial Managed Care - PPO

## 2017-04-11 VITALS — BP 126/88 | HR 100 | Ht 70.0 in | Wt 180.0 lb

## 2017-04-11 DIAGNOSIS — Z9889 Other specified postprocedural states: Secondary | ICD-10-CM

## 2017-04-11 MED ORDER — OXYCODONE HCL 5 MG PO TABS: ORAL_TABLET | ORAL | 0 refills | Status: DC
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
## 2017-04-26 ENCOUNTER — Ambulatory Visit: Payer: Commercial Managed Care - PPO | Admitting: Cardiovascular Disease

## 2017-04-27 ENCOUNTER — Encounter: Payer: Self-pay | Admitting: Cardiovascular Disease

## 2017-04-27 ENCOUNTER — Ambulatory Visit: Payer: Commercial Managed Care - PPO | Admitting: Cardiovascular Disease

## 2017-04-27 VITALS — BP 120/90 | HR 96 | Ht 70.0 in | Wt 189.4 lb

## 2017-04-27 DIAGNOSIS — Q676 Pectus excavatum: Secondary | ICD-10-CM

## 2017-04-27 DIAGNOSIS — I429 Cardiomyopathy, unspecified: Secondary | ICD-10-CM | POA: Diagnosis not present

## 2017-04-27 DIAGNOSIS — I4510 Unspecified right bundle-branch block: Secondary | ICD-10-CM | POA: Diagnosis not present

## 2017-04-27 DIAGNOSIS — I5189 Other ill-defined heart diseases: Secondary | ICD-10-CM | POA: Diagnosis not present

## 2017-04-27 NOTE — Progress Notes (Signed)
SUBJECTIVE: The patient presents for routine postoperative follow-up.  He underwent surgical pectus excavatum repair on 03/28/17. He has a cardiomyopathy which appears to be related to pectus excavatum.  He is a Engineer, civil (consulting)nurse by profession.  He is here with his wife who also works in Teacher, musichealthcare.  There was a question of pulmonary disease prior to undergoing surgery.  I referred him to Dr. Kendrick FriesMcQuaid. Lung function test with diffusion capacity was repeated and found to be within normal limits.  His wife says he has been anxious about the possibility of a redo surgery.  He had been looking up things on YouTube and got in touch with another person who required a redo surgery.  Overall he is doing quite well from a symptomatic standpoint.  He has some chest wall numbness.  He denies exertional chest tightness and dyspnea.  He denies palpitations and leg swelling.  He has thought about playing guitar as he has 12 weeks off from work.    Review of Systems: As per "subjective", otherwise negative.  Allergies  Allergen Reactions  . Bee Venom Hives  . Penicillins Other (See Comments)    UNSPECIFIED REACTION AS A CHILD  Has patient had a PCN reaction causing immediate rash, facial/tongue/throat swelling, SOB or lightheadedness with hypotension: Unknown Has patient had a PCN reaction causing severe rash involving mucus membranes or skin necrosis: Unknown Has patient had a PCN reaction that required hospitalization: Unknown Has patient had a PCN reaction occurring within the last 10 years: No If all of the above answers are "NO", then may proceed with Cephalosporin use.     Current Outpatient Medications  Medication Sig Dispense Refill  . calcium carbonate (TUMS - DOSED IN MG ELEMENTAL CALCIUM) 500 MG chewable tablet Chew 2 tablets by mouth 2 (two) times daily as needed for indigestion or heartburn.    Marland Kitchen. ibuprofen (ADVIL,MOTRIN) 800 MG tablet Take 800 mg by mouth every 8 (eight) hours as needed  for mild pain.    Marland Kitchen. oxyCODONE (OXY IR/ROXICODONE) 5 MG immediate release tablet Take 5 mg by mouth every 6 hours PRN severe pain 30 tablet 0  . sodium-potassium bicarbonate (ALKA-SELTZER GOLD) TBEF dissolvable tablet Take 1 tablet by mouth daily as needed (indigestion).    . traMADol (ULTRAM) 50 MG tablet Take 1 tablet (50 mg total) every 6 (six) hours as needed by mouth. 30 tablet 0  . amphetamine-dextroamphetamine (ADDERALL) 20 MG tablet Take 20 mg by mouth daily.     No current facility-administered medications for this visit.     Past Medical History:  Diagnosis Date  . ADD (attention deficit disorder)   . GERD (gastroesophageal reflux disease)   . Palpitations   . Pectus excavatum   . RBBB (right bundle branch block)   . Wears contact lenses   . Wears glasses     Past Surgical History:  Procedure Laterality Date  . NOSE SURGERY    . PECTUS EXCAVATUM REPAIR N/A 03/28/2017   Procedure: REPAIR OF PECTUS EXCAVATUM WITH MODIFIED RAVITCH PROCEDURE;  Surgeon: Delight OvensGerhardt, Edward B, MD;  Location: [facility] OR;  Service: Thoracic;  Laterality: N/A;  . RIB PLATING  03/28/2017   Procedure: STERNAL PLATING;  Surgeon: Delight OvensGerhardt, Edward B, MD;  [facility];  Service: Thoracic;;    Social History   Socioeconomic History  . Marital status: Married    Spouse name: Not on file  . Number of children: Not on file  . Years of education: Not on file  .  Highest education level: Not on file  Social Needs  . Financial resource strain: Not on file  . Food insecurity - worry: Not on file  . Food insecurity - inability: Not on file  . Transportation needs - medical: Not on file  . Transportation needs - non-medical: Not on file  Occupational History  . Not on file  Tobacco Use  . Smoking status: Former Smoker    Packs/day: 1.00    Years: 4.00    Pack years: 4.00  . Smokeless tobacco: Current User    Types: Chew  . Tobacco comment: quit smoking in 2006  Substance and Sexual Activity  . Alcohol  use: Yes    Comment: occ  . Drug use: No  . Sexual activity: Not on file  Other Topics Concern  . Not on file  Social History Narrative  . Not on file     Vitals:   04/27/17 1338  BP: 120/90  Pulse: 96  SpO2: 98%  Weight: 189 lb 6.4 oz (85.9 kg)  Height: 5\' 10"  (1.778 m)    Wt Readings from Last 3 Encounters:  04/27/17 189 lb 6.4 oz (85.9 kg)  04/11/17 180 lb (81.6 kg)  04/06/17 180 lb (81.6 kg)     PHYSICAL EXAM General: NAD HEENT: Normal. Neck: No JVD, no thyromegaly. Lungs: Clear to auscultation bilaterally with normal respiratory effort. CV: Regular rate and rhythm, normal S1/S2, no S3/S4, no murmur. No pretibial or periankle edema. Abdomen: Soft, nontender, no distention.  Neurologic: Alert and oriented.  Psych: Normal affect. Skin: Normal. Musculoskeletal: No gross deformities.    ECG: Most recent ECG reviewed.   Labs: Lab Results  Component Value Date/Time   K 3.3 (L) 03/30/2017 04:49 AM   BUN 12 03/30/2017 04:49 AM   CREATININE 0.92 03/30/2017 04:49 AM   ALT 18 03/30/2017 04:49 AM   HGB 11.2 (L) 03/31/2017 04:10 AM     Lipids: No results found for: LDLCALC, LDLDIRECT, CHOL, TRIG, HDL     ASSESSMENT AND PLAN: 1.  Cardiomyopathy in context of pectus excavatum: Symptomatically stable.  As he has successfully undergone surgical repair, I will repeat an echocardiogram in several months to assess for improvement in left ventricular systolic function.  LVEF 40-45% on 11/10/16.  No medical therapy is indicated at this time.  2.  Right ventricular abnormalities on echocardiography: There was ventricular septal flattening in systole suggestive of RV pressure overload.  This may very well have been due to pectus excavatum.  I will repeat an echocardiogram in several months as he is undergone surgical repair for this.  I see no indication for right heart catheterization at this time.   Disposition: Follow up 6 months   Prentice DockerSuresh Koneswaran, M.D., F.A.C.C.

## 2017-04-27 NOTE — Patient Instructions (Signed)
Medication Instructions:  Your physician recommends that you continue on your current medications as directed. Please refer to the Current Medication list given to you today.   Labwork: NONE  Testing/Procedures: Your physician has requested that you have an echocardiogram. Echocardiography is a painless test that uses sound waves to create images of your heart. It provides your doctor with information about the size and shape of your heart and how well your heart's chambers and valves are working. This procedure takes approximately one hour. There are no restrictions for this procedure.(6 MONTHS )   Follow-Up: Your physician wants you to follow-up in: 6 MONTHS .  You will receive a reminder letter in the mail two months in advance. If you don't receive a letter, please call our office to schedule the follow-up appointment.   Any Other Special Instructions Will Be Listed Below (If Applicable).     If you need a refill on your cardiac medications before your next appointment, please call your pharmacy.

## 2017-05-03 ENCOUNTER — Telehealth: Payer: Self-pay

## 2017-05-03 NOTE — Telephone Encounter (Signed)
Patient called to report that he fell in the snow today and landed on this right shoulder. He has some soreness. No chest pain or trouble breathing. He was instructed to call back tomorrow if he develops any chest pain or trouble breathing for appt and CXR.

## 2017-05-19 ENCOUNTER — Ambulatory Visit: Payer: Self-pay | Admitting: Cardiothoracic Surgery

## 2017-05-19 NOTE — Progress Notes (Deleted)
[address]       [place] [id number]             [phone]      Marcus Ellis Date of Birth: 10-24-1980  Referring: Selinda FlavinHoward, Kevin, MD Primary Care: Selinda FlavinHoward, Kevin, MD  Chief Complaint:   POST OP FOLLOW UP  History of Present Illness:        Past Medical History:  Diagnosis Date  . ADD (attention deficit disorder)   . GERD (gastroesophageal reflux disease)   . Palpitations   . Pectus excavatum   . RBBB (right bundle branch block)   . Wears contact lenses   . Wears glasses      Social History   Tobacco Use  Smoking Status Former Smoker  . Packs/day: 1.00  . Years: 4.00  . Pack years: 4.00  Smokeless Tobacco Current User  . Types: Chew  Tobacco Comment   quit smoking in 2006    Social History   Substance and Sexual Activity  Alcohol Use Yes   Comment: occ     Allergies  Allergen Reactions  . Bee Venom Hives  . Penicillins Other (See Comments)    UNSPECIFIED REACTION AS A CHILD  Has patient had a PCN reaction causing immediate rash, facial/tongue/throat swelling, SOB or lightheadedness with hypotension: Unknown Has patient had a PCN reaction causing severe rash involving mucus membranes or skin necrosis: Unknown Has patient had a PCN reaction that required hospitalization: Unknown Has patient had a PCN reaction occurring within the last 10 years: No If all of the above answers are "NO", then may proceed with Cephalosporin use.     Current Outpatient Medications  Medication Sig Dispense Refill  . amphetamine-dextroamphetamine (ADDERALL) 20 MG tablet Take 20 mg by mouth daily.    . calcium carbonate (TUMS - DOSED IN MG ELEMENTAL CALCIUM) 500 MG chewable tablet Chew 2 tablets by mouth 2 (two) times daily as needed for indigestion or heartburn.    Marland Kitchen. ibuprofen (ADVIL,MOTRIN) 800 MG tablet Take 800 mg by mouth every 8 (eight) hours as needed for mild pain.    Marland Kitchen. oxyCODONE (OXY IR/ROXICODONE)  5 MG immediate release tablet Take 5 mg by mouth every 6 hours PRN severe pain 30 tablet 0  . sodium-potassium bicarbonate (ALKA-SELTZER GOLD) TBEF dissolvable tablet Take 1 tablet by mouth daily as needed (indigestion).    . traMADol (ULTRAM) 50 MG tablet Take 1 tablet (50 mg total) every 6 (six) hours as needed by mouth. 30 tablet 0   No current facility-administered medications for this visit.        Physical Exam: There were no vitals taken for this visit.  {physical exam:21449}   Diagnostic Studies & Laboratory data:     Recent Radiology Findings:   No results found.     Recent Lab Findings: Lab Results  Component Value Date   WBC 6.8 03/31/2017   HGB 11.2 (L) 03/31/2017   HCT 33.1 (L) 03/31/2017   PLT 178 03/31/2017   GLUCOSE 116 (H) 03/30/2017   ALT 18 03/30/2017   AST 23 03/30/2017   NA 131 (L) 03/30/2017   K 3.3 (L) 03/30/2017   CL 99 (L) 03/30/2017   CREATININE 0.92 03/30/2017   BUN 12 03/30/2017   CO2 26 03/30/2017   INR 1.04 03/24/2017      Assessment / Plan:      Delight OvensEdward B Tarisa Paola MD      [phone]  [address] [place] [id number] Office [phone]   Beeper [phone]  05/19/2017 7:42 AM

## 2017-06-02 ENCOUNTER — Other Ambulatory Visit: Payer: Self-pay | Admitting: *Deleted

## 2017-06-02 ENCOUNTER — Ambulatory Visit (INDEPENDENT_AMBULATORY_CARE_PROVIDER_SITE_OTHER): Payer: Self-pay | Admitting: Cardiothoracic Surgery

## 2017-06-02 ENCOUNTER — Other Ambulatory Visit: Payer: Self-pay

## 2017-06-02 ENCOUNTER — Ambulatory Visit
Admission: RE | Admit: 2017-06-02 | Discharge: 2017-06-02 | Disposition: A | Discharge status: Home / Self Care | Payer: Commercial Managed Care - PPO | Source: Ambulatory Visit | Attending: Cardiothoracic Surgery | Admitting: Cardiothoracic Surgery

## 2017-06-02 ENCOUNTER — Encounter: Payer: Self-pay | Admitting: Cardiothoracic Surgery

## 2017-06-02 VITALS — BP 133/96 | HR 90 | Resp 18 | Ht 70.0 in | Wt 187.2 lb

## 2017-06-02 DIAGNOSIS — Q676 Pectus excavatum: Secondary | ICD-10-CM

## 2017-06-02 DIAGNOSIS — Z9889 Other specified postprocedural states: Secondary | ICD-10-CM

## 2017-06-02 DIAGNOSIS — Q677 Pectus carinatum: Secondary | ICD-10-CM

## 2017-06-02 NOTE — Progress Notes (Signed)
[address]       [place] [address]             [phone]      Marcus Ellis [facility] Medical Record #[id number] Date of Birth: 1980-07-19  Referring: Laqueta Linden, MD Primary Care: Selinda Flavin, MD  Chief Complaint:   POST OP FOLLOW UP 03/28/2017 OPERATIVE REPORT PREOPERATIVE DIAGNOSIS:  Severe pectus excavatum. POSTOPERATIVE DIAGNOSIS:  Severe pectus excavatum. PROCEDURE:  Surgical repair of pectus excavatum by modified Ravitch technique with sternal plating. SURGEON:  Sheliah Plane, MD.   History of Present Illness:     Patient returns today postop check.  He continues to improve notes that the incisional pain has significantly improved, he still has some discomfort across the right posterior ribs.  His disability runs out January 28   Past Medical History:  Diagnosis Date  . ADD (attention deficit disorder)   . GERD (gastroesophageal reflux disease)   . Palpitations   . Pectus excavatum   . RBBB (right bundle branch block)   . Wears contact lenses   . Wears glasses      Social History   Tobacco Use  Smoking Status Former Smoker  . Packs/day: 1.00  . Years: 4.00  . Pack years: 4.00  Smokeless Tobacco Current User  . Types: Chew  Tobacco Comment   quit smoking in 2006    Social History   Substance and Sexual Activity  Alcohol Use Yes   Comment: occ     Allergies  Allergen Reactions  . Bee Venom Hives  . Penicillins Other (See Comments)    UNSPECIFIED REACTION AS A CHILD  Has patient had a PCN reaction causing immediate rash, facial/tongue/throat swelling, SOB or lightheadedness with hypotension: Unknown Has patient had a PCN reaction causing severe rash involving mucus membranes or skin necrosis: Unknown Has patient had a PCN reaction that required hospitalization: Unknown Has patient had a PCN reaction occurring within the last 10 years: No If all of the above answers are "NO", then may proceed with  Cephalosporin use.     Current Outpatient Medications  Medication Sig Dispense Refill  . amphetamine-dextroamphetamine (ADDERALL) 20 MG tablet Take 20 mg by mouth daily.    . calcium carbonate (TUMS - DOSED IN MG ELEMENTAL CALCIUM) 500 MG chewable tablet Chew 2 tablets by mouth 2 (two) times daily as needed for indigestion or heartburn.    Marland Kitchen ibuprofen (ADVIL,MOTRIN) 800 MG tablet Take 800 mg by mouth every 8 (eight) hours as needed for mild pain.    . sodium-potassium bicarbonate (ALKA-SELTZER GOLD) TBEF dissolvable tablet Take 1 tablet by mouth daily as needed (indigestion).     No current facility-administered medications for this visit.        Physical Exam: BP (!) 133/96 (BP Location: Left Arm, Patient Position: Sitting, Cuff Size: Large)   Pulse 90   Resp 18   Ht 5\' 10"  (1.778 m)   Wt 187 lb 3.2 oz (84.9 kg)   SpO2 99% Comment: RA  BMI 26.86 kg/m   General appearance: alert and cooperative Head: Normocephalic, without obvious abnormality, atraumatic Neck: no adenopathy, no carotid bruit, no JVD, supple, symmetrical, trachea midline and thyroid not enlarged, symmetric, no tenderness/mass/nodules Resp: clear to auscultation bilaterally Back: symmetric, no curvature. ROM normal. No CVA tenderness. Cardio: regular rate and rhythm, S1, S2 normal, no murmur, click, rub or gallop Extremities: extremities normal, atraumatic, no cyanosis or edema and Homans sign is negative,  no sign of DVT Neurologic: Grossly normal  Patient's incision is well-healed without evidence of infection, overall reposition of the ribs appear symmetric, will photograph on the patient's next visit  Diagnostic Studies & Laboratory data:     Recent Radiology Findings:   No results found.     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   Plan to see him back in 3 months  Delight OvensEdward B Gursimran Litaker MD      [address] [place] [id number] Office [phone]   Beeper [phone]  06/02/2017 10:40 AM

## 2017-06-06 ENCOUNTER — Telehealth: Payer: Self-pay | Admitting: Cardiovascular Disease

## 2017-06-06 NOTE — Telephone Encounter (Signed)
I think that would be fine. He could consider doing cardiac rehab for starters, which may help him overcome some of the anxiety about exercise after surgery.

## 2017-06-06 NOTE — Telephone Encounter (Signed)
Patient notified and verbalized understanding. 

## 2017-06-06 NOTE — Telephone Encounter (Signed)
Patient stated that he did discuss this with Dr. Tyrone SageGerhardt and was told that walking & jogging was okay.  He wanted to check with you to see if you had any specific concerns relating to heart function.  Concerned about getting heart rate elevated with cardio exercise.

## 2017-06-06 NOTE — Telephone Encounter (Signed)
Patient recently had surgery. Wanted to check with Dr Purvis SheffieldKoneswaran to see if he can do some form of cardio exercise

## 2017-07-27 LAB — PULMONARY FUNCTION TEST

## 2017-08-31 ENCOUNTER — Other Ambulatory Visit: Payer: Self-pay | Admitting: Cardiothoracic Surgery

## 2017-08-31 DIAGNOSIS — Q676 Pectus excavatum: Secondary | ICD-10-CM

## 2017-09-01 ENCOUNTER — Other Ambulatory Visit: Payer: Self-pay

## 2017-09-01 ENCOUNTER — Ambulatory Visit
Admission: RE | Admit: 2017-09-01 | Discharge: 2017-09-01 | Disposition: A | Discharge status: Home / Self Care | Payer: Commercial Managed Care - PPO | Source: Ambulatory Visit | Attending: Cardiothoracic Surgery | Admitting: Cardiothoracic Surgery

## 2017-09-01 ENCOUNTER — Encounter: Payer: Self-pay | Admitting: Cardiothoracic Surgery

## 2017-09-01 ENCOUNTER — Ambulatory Visit: Payer: Commercial Managed Care - PPO | Admitting: Cardiothoracic Surgery

## 2017-09-01 VITALS — BP 136/85 | HR 99 | Resp 18 | Ht 70.0 in | Wt 177.0 lb

## 2017-09-01 DIAGNOSIS — Q676 Pectus excavatum: Secondary | ICD-10-CM

## 2017-09-01 NOTE — Progress Notes (Signed)
[address]       [place] [address]             [phone]      WILMORE HOLSOMBACK [facility] Medical Record #[id number] Date of Birth: 1981/02/16  Referring: Laqueta Linden, MD Primary Care: Selinda Flavin, MD  Chief Complaint:   POST OP FOLLOW UP 03/28/2017 OPERATIVE REPORT PREOPERATIVE DIAGNOSIS:  Severe pectus excavatum. POSTOPERATIVE DIAGNOSIS:  Severe pectus excavatum. PROCEDURE:  Surgical repair of pectus excavatum by modified Ravitch technique with sternal plating. SURGEON:  Sheliah Plane, MD.   History of Present Illness:     Patient returns today postop check.  Patient has been doing well postoperatively, and notes he is pleased with the result.  Chest discomfort has resolved. He is back working without difficulty   Past Medical History:  Diagnosis Date  . ADD (attention deficit disorder)   . GERD (gastroesophageal reflux disease)   . Palpitations   . Pectus excavatum   . RBBB (right bundle branch block)   . Wears contact lenses   . Wears glasses      Social History   Tobacco Use  Smoking Status Former Smoker  . Packs/day: 1.00  . Years: 4.00  . Pack years: 4.00  Smokeless Tobacco Current User  . Types: Chew  Tobacco Comment   quit smoking in 2006    Social History   Substance and Sexual Activity  Alcohol Use Yes   Comment: occ     Allergies  Allergen Reactions  . Bee Venom Hives  . Penicillins Other (See Comments)    UNSPECIFIED REACTION AS A CHILD  Has patient had a PCN reaction causing immediate rash, facial/tongue/throat swelling, SOB or lightheadedness with hypotension: Unknown Has patient had a PCN reaction causing severe rash involving mucus membranes or skin necrosis: Unknown Has patient had a PCN reaction that required hospitalization: Unknown Has patient had a PCN reaction occurring within the last 10 years: No If all of the above answers are "NO", then may proceed with Cephalosporin use.      Current Outpatient Medications  Medication Sig Dispense Refill  . amphetamine-dextroamphetamine (ADDERALL) 20 MG tablet Take 20 mg by mouth daily.    . calcium carbonate (TUMS - DOSED IN MG ELEMENTAL CALCIUM) 500 MG chewable tablet Chew 2 tablets by mouth 2 (two) times daily as needed for indigestion or heartburn.    Marland Kitchen ibuprofen (ADVIL,MOTRIN) 800 MG tablet Take 800 mg by mouth every 8 (eight) hours as needed for mild pain.    . sodium-potassium bicarbonate (ALKA-SELTZER GOLD) TBEF dissolvable tablet Take 1 tablet by mouth daily as needed (indigestion).     No current facility-administered medications for this visit.        Physical Exam: BP 136/85 (BP Location: Right Arm, Patient Position: Sitting, Cuff Size: Normal)   Pulse 99   Resp 18   Ht 5\' 10"  (1.778 m)   Wt 177 lb (80.3 kg)   SpO2 98% Comment: RA  BMI 25.40 kg/m   General appearance: alert and cooperative Head: Normocephalic, without obvious abnormality, atraumatic Neck: no adenopathy, no carotid bruit, no JVD, supple, symmetrical, trachea midline and thyroid not enlarged, symmetric, no tenderness/mass/nodules Resp: clear to auscultation bilaterally Back: symmetric, no curvature. ROM normal. No CVA tenderness. Cardio: regular rate and rhythm, S1, S2 normal, no murmur, click, rub or gallop Extremities: extremities normal, atraumatic, no cyanosis or edema and Homans sign is negative, no sign of DVT Neurologic: Grossly normal  Patient's incision is well-healed without evidence of infection, overall reposition of the ribs appear symmetric   Diagnostic Studies & Laboratory data:     Recent Radiology Findings:   Dg Chest 2 View  Result Date: 09/01/2017 CLINICAL DATA:  Pectus excavatum surgery July 26, 2016. Chest tightness. EXAM: CHEST - 2 VIEW COMPARISON:  June 02, 2017 FINDINGS: The heart, hila, and mediastinum are unchanged. Haziness in the right perihilar region is stable and not acute. No pneumothorax. No  nodules or masses. No focal infiltrates. Surgical plates are stable. No interval changes. IMPRESSION: No active cardiopulmonary disease. Electronically Signed   By: Gerome Samavid  Williams III M.D   On: 09/01/2017 14:03       Recent Lab Findings: Lab Results  Component Value Date   WBC 6.8 03/31/2017   HGB 11.2 (L) 03/31/2017   HCT 33.1 (L) 03/31/2017   PLT 178 03/31/2017   GLUCOSE 116 (H) 03/30/2017   ALT 18 03/30/2017   AST 23 03/30/2017   NA 131 (L) 03/30/2017   K 3.3 (L) 03/30/2017   CL 99 (L) 03/30/2017   CREATININE 0.92 03/30/2017   BUN 12 03/30/2017   CO2 26 03/30/2017   INR 1.04 03/24/2017      Assessment / Plan:   Stable pectus repair, chest x-ray and exam appear stable Plan to see him back in 6months with follow-up chest x-ray  Delight OvensEdward B Nathen Balaban MD      [address] [place] [id number] Office [phone]   Beeper [phone]  09/01/2017 6:10 PM

## 2017-10-26 ENCOUNTER — Ambulatory Visit (INDEPENDENT_AMBULATORY_CARE_PROVIDER_SITE_OTHER): Payer: Commercial Managed Care - PPO

## 2017-10-26 ENCOUNTER — Other Ambulatory Visit: Payer: Self-pay

## 2017-10-26 DIAGNOSIS — I429 Cardiomyopathy, unspecified: Secondary | ICD-10-CM | POA: Diagnosis not present

## 2017-10-27 ENCOUNTER — Other Ambulatory Visit: Payer: Self-pay

## 2017-10-27 ENCOUNTER — Encounter: Payer: Self-pay | Admitting: Cardiovascular Disease

## 2017-10-27 ENCOUNTER — Ambulatory Visit (INDEPENDENT_AMBULATORY_CARE_PROVIDER_SITE_OTHER): Payer: Commercial Managed Care - PPO | Admitting: Cardiovascular Disease

## 2017-10-27 VITALS — BP 140/93 | HR 90 | Ht 70.0 in | Wt 177.0 lb

## 2017-10-27 DIAGNOSIS — Z7182 Exercise counseling: Secondary | ICD-10-CM | POA: Diagnosis not present

## 2017-10-27 DIAGNOSIS — I4510 Unspecified right bundle-branch block: Secondary | ICD-10-CM | POA: Diagnosis not present

## 2017-10-27 DIAGNOSIS — I429 Cardiomyopathy, unspecified: Secondary | ICD-10-CM | POA: Diagnosis not present

## 2017-10-27 DIAGNOSIS — Q676 Pectus excavatum: Secondary | ICD-10-CM | POA: Diagnosis not present

## 2017-10-27 NOTE — Patient Instructions (Addendum)

## 2017-10-27 NOTE — Progress Notes (Signed)
SUBJECTIVE: The patient presents for routine follow-up. He underwent surgical pectus excavatum repair on 03/28/17. He has a cardiomyopathy which appears to be related to pectus excavatum.  Follow-up echocardiogram obtained on 10/26/2017 showed mildly reduced left ventricular systolic function, LVEF 45 to 45%.  Diastolic parameters were normal.  Right ventricular size and function was normal.  This was an improvement from previous LVEF of 40 to 45%.  He also previously had ventricular septal flattening in systole suggestive of RV pressure overload.  This was likely due to pectus excavatum.  He said he was very anxious about today's appointment.  He was discouraged by the echocardiogram results.  He has been running some and also swimming.  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  He denies exertional chest pain and exertional dyspnea.  Overall he feels like his exercise tolerance has improved.  He has a list of questions regarding exercising which I answered.  He also has questions about eventually working for the [employer].   Social history: He is married to [place].  He is a Engineer, civil (consulting) by profession.  He wants to eventually work for the [employer].  His wife works in Teacher, music.  Review of Systems: As per "subjective", otherwise negative.  Allergies  Allergen Reactions  . Bee Venom Hives  . Penicillins Other (See Comments)    UNSPECIFIED REACTION AS A CHILD  Has patient had a PCN reaction causing immediate rash, facial/tongue/throat swelling, SOB or lightheadedness with hypotension: Unknown Has patient had a PCN reaction causing severe rash involving mucus membranes or skin necrosis: Unknown Has patient had a PCN reaction that required hospitalization: Unknown Has patient had a PCN reaction occurring within the last 10 years: No If all of the above answers are "NO", then may proceed with Cephalosporin use.     Current Outpatient Medications  Medication Sig Dispense Refill  .  amphetamine-dextroamphetamine (ADDERALL) 20 MG tablet Take 20 mg by mouth daily.    . calcium carbonate (TUMS - DOSED IN MG ELEMENTAL CALCIUM) 500 MG chewable tablet Chew 2 tablets by mouth 2 (two) times daily as needed for indigestion or heartburn.    Marland Kitchen ibuprofen (ADVIL,MOTRIN) 800 MG tablet Take 800 mg by mouth every 8 (eight) hours as needed for mild pain.    . sodium-potassium bicarbonate (ALKA-SELTZER GOLD) TBEF dissolvable tablet Take 1 tablet by mouth daily as needed (indigestion).     No current facility-administered medications for this visit.     Past Medical History:  Diagnosis Date  . ADD (attention deficit disorder)   . GERD (gastroesophageal reflux disease)   . Palpitations   . Pectus excavatum   . RBBB (right bundle branch block)   . Wears contact lenses   . Wears glasses     Past Surgical History:  Procedure Laterality Date  . NOSE SURGERY    . PECTUS EXCAVATUM REPAIR N/A 03/28/2017   Procedure: REPAIR OF PECTUS EXCAVATUM WITH MODIFIED RAVITCH PROCEDURE;  Surgeon: Delight Ovens, MD;  Location: [facility] OR;  Service: Thoracic;  Laterality: N/A;  . RIB PLATING  03/28/2017   Procedure: STERNAL PLATING;  Surgeon: Delight Ovens, MD;  Location: [facility];  Service: Thoracic;;    Social History   Socioeconomic History  . Marital status: Married    Spouse name: Not on file  . Number of children: Not on file  . Years of education: Not on file  . Highest education level: Not on file  Occupational History  . Not on  file  Social Needs  . Financial resource strain: Not on file  . Food insecurity:    Worry: Not on file    Inability: Not on file  . Transportation needs:    Medical: Not on file    Non-medical: Not on file  Tobacco Use  . Smoking status: Former Smoker    Packs/day: 1.00    Years: 4.00    Pack years: 4.00  . Smokeless tobacco: Current User    Types: Chew  . Tobacco comment: quit smoking in 2006  Substance and Sexual Activity  . Alcohol use: Yes     Comment: occ  . Drug use: No  . Sexual activity: Not on file  Lifestyle  . Physical activity:    Days per week: Not on file    Minutes per session: Not on file  . Stress: Not on file  Relationships  . Social connections:    Talks on phone: Not on file    Gets together: Not on file    Attends religious service: Not on file    Active member of club or organization: Not on file    Attends meetings of clubs or organizations: Not on file    Relationship status: Not on file  . Intimate partner violence:    Fear of current or ex partner: Not on file    Emotionally abused: Not on file    Physically abused: Not on file    Forced sexual activity: Not on file  Other Topics Concern  . Not on file  Social History Narrative  . Not on file     Vitals:   10/27/17 1441  BP: (!) 140/93  Pulse: 90  SpO2: 99%  Weight: 177 lb (80.3 kg)  Height: 5\' 10"  (1.778 m)    Wt Readings from Last 3 Encounters:  10/27/17 177 lb (80.3 kg)  09/01/17 177 lb (80.3 kg)  06/02/17 187 lb 3.2 oz (84.9 kg)     PHYSICAL EXAM General: NAD HEENT: Normal. Neck: No JVD, no thyromegaly. Lungs: Clear to auscultation bilaterally with normal respiratory effort. CV: Regular rate and rhythm, normal S1/S2, no S3/S4, no murmur. No pretibial or periankle edema.   Abdomen: Soft, nontender, no distention.  Neurologic: Alert and oriented.  Psych: Normal affect. Skin: Normal. Musculoskeletal: No gross deformities.    ECG: Most recent ECG reviewed.   Labs: Lab Results  Component Value Date/Time   K 3.3 (L) 03/30/2017 04:49 AM   BUN 12 03/30/2017 04:49 AM   CREATININE 0.92 03/30/2017 04:49 AM   ALT 18 03/30/2017 04:49 AM   HGB 11.2 (L) 03/31/2017 04:10 AM     Lipids: No results found for: LDLCALC, LDLDIRECT, CHOL, TRIG, HDL     ASSESSMENT AND PLAN:  1.  Cardiomyopathy in context of pectus excavatum: Symptomatically stable and actually improved.    Echocardiogram reviewed above from 10/26/2017  demonstrating an overall improvement in cardiac function, LVEF now 45 to 50%.  No medical therapy is indicated at this time.  I had a lengthy discussion with him about exercising including swimming and jogging.  He currently has weight lifting restrictions as per CT surgery.  2.  Right ventricular abnormalities on echocardiography: There was ventricular septal flattening in systole suggestive of RV pressure overload.  This was likely due to pectus excavatum.  This was not seen on a follow-up echocardiogram performed on 10/26/2017 as reviewed above.      Disposition: Follow up 1 year as per his request  Time spent:  25 minutes  Prentice Docker, M.D., F.A.C.C.

## 2018-03-01 ENCOUNTER — Other Ambulatory Visit: Payer: Self-pay | Admitting: Cardiothoracic Surgery

## 2018-03-01 DIAGNOSIS — Q676 Pectus excavatum: Secondary | ICD-10-CM

## 2018-03-02 ENCOUNTER — Encounter: Payer: Self-pay | Admitting: Cardiothoracic Surgery

## 2018-03-03 ENCOUNTER — Encounter: Payer: Self-pay | Admitting: Cardiothoracic Surgery

## 2018-03-03 ENCOUNTER — Other Ambulatory Visit: Payer: Self-pay

## 2018-03-03 ENCOUNTER — Ambulatory Visit
Admission: RE | Admit: 2018-03-03 | Discharge: 2018-03-03 | Disposition: A | Discharge status: Home / Self Care | Payer: Self-pay | Source: Ambulatory Visit | Attending: Cardiothoracic Surgery | Admitting: Cardiothoracic Surgery

## 2018-03-03 ENCOUNTER — Ambulatory Visit (INDEPENDENT_AMBULATORY_CARE_PROVIDER_SITE_OTHER): Payer: PRIVATE HEALTH INSURANCE | Admitting: Cardiothoracic Surgery

## 2018-03-03 VITALS — BP 133/88 | HR 93 | Resp 18 | Ht 70.0 in | Wt 181.0 lb

## 2018-03-03 DIAGNOSIS — Q676 Pectus excavatum: Secondary | ICD-10-CM

## 2018-03-03 NOTE — Progress Notes (Signed)
[address]       [place] [address]             [phone]      Marcus Ellis [facility] Medical Record #[id number] Date of Birth: [birth year]  Referring: Marcus Linden, MD Primary Care: Marcus Flavin, MD  Chief Complaint:   POST OP FOLLOW UP 03/28/2017 OPERATIVE REPORT PREOPERATIVE DIAGNOSIS:  Severe pectus excavatum. POSTOPERATIVE DIAGNOSIS:  Severe pectus excavatum. PROCEDURE:  Surgical repair of pectus excavatum by modified Ravitch technique with sternal plating. SURGEON:  Marcus Plane, MD.   History of Present Illness:     Patient returns to the office in follow-up after surgery almost a year ago.  Symptomatically he is been doing well notes that his respiratory status is improved, recent echocardiogram showed an increase in ejection fraction.  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  Past Medical History:  Diagnosis Date  . ADD (attention deficit disorder)   . GERD (gastroesophageal reflux disease)   . Palpitations   . Pectus excavatum   . RBBB (right bundle branch block)   . Wears contact lenses   . Wears glasses      Social History   Tobacco Use  Smoking Status Former Smoker  . Packs/day: 1.00  . Years: 4.00  . Pack years: 4.00  Smokeless Tobacco Current User  . Types: Chew  Tobacco Comment   quit smoking in 2006    Social History   Substance and Sexual Activity  Alcohol Use Yes   Comment: occ     Allergies  Allergen Reactions  . Bee Venom Hives  . Penicillins Other (See Comments)    UNSPECIFIED REACTION AS A CHILD  Has patient had a PCN reaction causing immediate rash, facial/tongue/throat swelling, SOB or lightheadedness with hypotension: Unknown Has patient had a PCN reaction causing severe rash involving mucus membranes or skin necrosis: Unknown Has patient had a PCN reaction that  required hospitalization: Unknown Has patient had a PCN reaction occurring within the last 10 years: No If all of the above answers are "NO", then may proceed with Cephalosporin use.     Current Outpatient Medications  Medication Sig Dispense Refill  . amphetamine-dextroamphetamine (ADDERALL) 20 MG tablet Take 20 mg by mouth daily.    . calcium carbonate (TUMS - DOSED IN MG ELEMENTAL CALCIUM) 500 MG chewable tablet Chew 2 tablets by mouth 2 (two) times daily as needed for indigestion or heartburn.    Marland Kitchen ibuprofen (ADVIL,MOTRIN) 800 MG tablet Take 800 mg by mouth every 8 (eight) hours as needed for mild pain.    . sodium-potassium bicarbonate (ALKA-SELTZER GOLD) TBEF dissolvable tablet Take 1 tablet by mouth daily as needed (indigestion).     No current facility-administered medications for this visit.        Physical Exam: BP 133/88 (BP Location: Right Arm, Patient Position: Sitting, Cuff Size: Normal)   Pulse 93   Resp 18   Ht 5\' 10"  (1.778 m)   Wt 181 lb (82.1 kg)   SpO2 97% Comment: RA  BMI 25.97 kg/m  General appearance: alert and cooperative Head: Normocephalic, without obvious abnormality, atraumatic Neck: no adenopathy, no carotid bruit, no JVD, supple, symmetrical, trachea midline and thyroid not enlarged, symmetric, no tenderness/mass/nodules Resp: clear to auscultation bilaterally Cardio: regular rate and rhythm, S1, S2 normal, no murmur, click, rub  or gallop Extremities: extremities normal, atraumatic, no cyanosis or edema Neurologic: Grossly normal Patient still has some mild degree of pectus but otherwise much improved, he is pleased with the result now approximately 1 year.  He has some minor keloid formation.   Diagnostic Studies & Laboratory data:     Recent Radiology Findings:   Dg Chest 2 View  Result Date: 03/03/2018 CLINICAL DATA:  Status post repair for pectus excavatum EXAM: CHEST - 2 VIEW COMPARISON:  September 01, 2017 FINDINGS: Postoperative changes  involving the sternum in the anterior right fifth rib remains stable. Mild pectus excavatum remains, unchanged from previous study. Lungs are clear. Heart size and pulmonary vascularity are normal. No adenopathy. There is mild upper thoracic levoscoliosis. IMPRESSION: Stable postoperative changes involving the sternum and right rib anteriorly. Mild pectus excavatum remains, unchanged in appearance from prior study. No new bone lesion. Mild scoliosis noted. Lungs clear.  Cardiac silhouette normal. Electronically Signed   By: Marcus Ellis III M.D.   On: 03/03/2018 11:36       Recent Lab Findings: Lab Results  Component Value Date   WBC 6.8 03/31/2017   HGB 11.2 (L) 03/31/2017   HCT 33.1 (L) 03/31/2017   PLT 178 03/31/2017   GLUCOSE 116 (H) 03/30/2017   ALT 18 03/30/2017   AST 23 03/30/2017   NA 131 (L) 03/30/2017   K 3.3 (L) 03/30/2017   CL 99 (L) 03/30/2017   CREATININE 0.92 03/30/2017   BUN 12 03/30/2017   CO2 26 03/30/2017   INR 1.04 03/24/2017      Assessment / Plan:   Stable pectus repair, chest x-ray and exam appear stable Plan to see him back in 12 months with follow-up chest x-ray  Marcus Ovens MD      [address] [place] [address] Office [phone]   Beeper [phone]  03/03/2018 12:38 PM

## 2018-05-29 IMAGING — DX DG CHEST 2V
2 series · 2 of 2 positions shown · non-contrast
Comparison: 03/30/2017

CLINICAL DATA: Status post pectus excavatum repair and rib plating.

EXAM:
CHEST  2 VIEW

[chest pa]
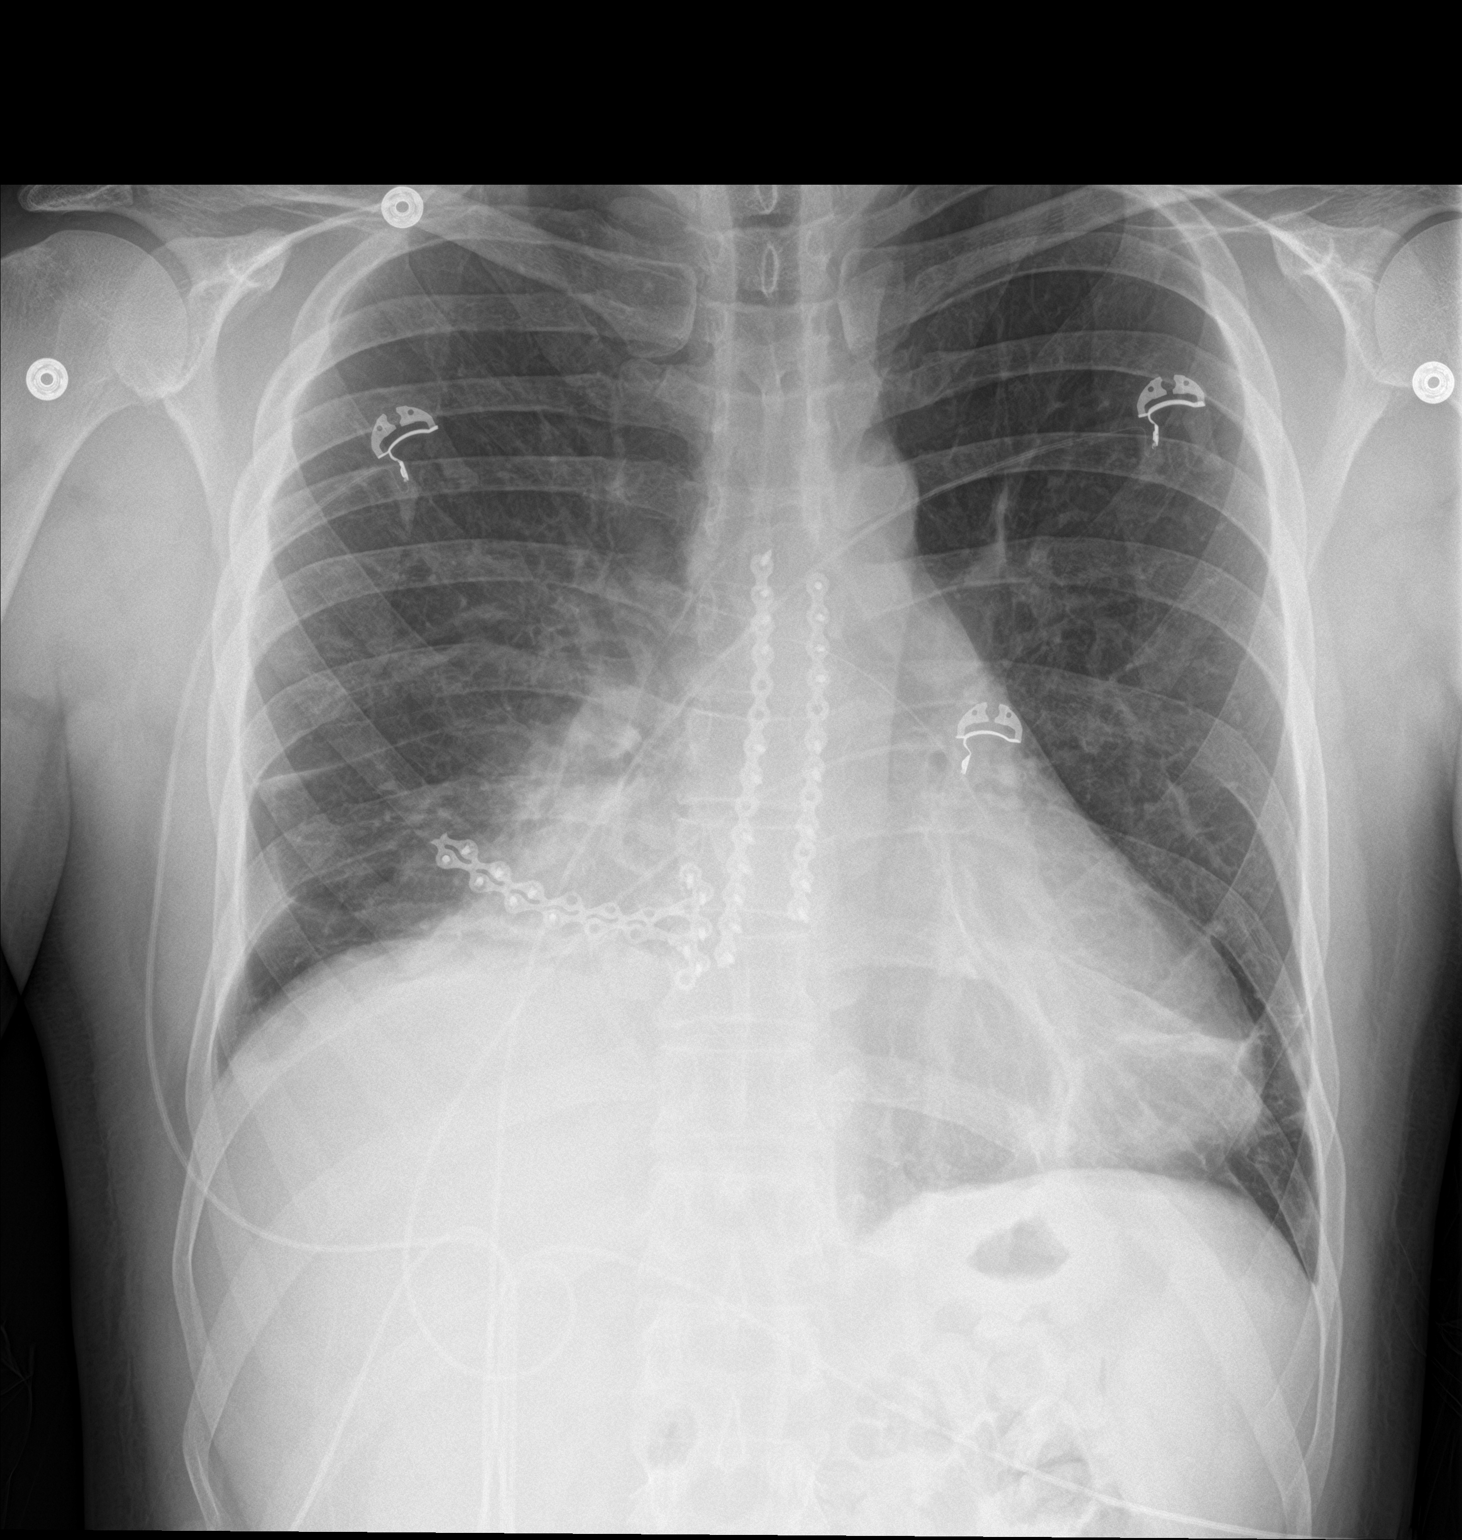

[chest lat]
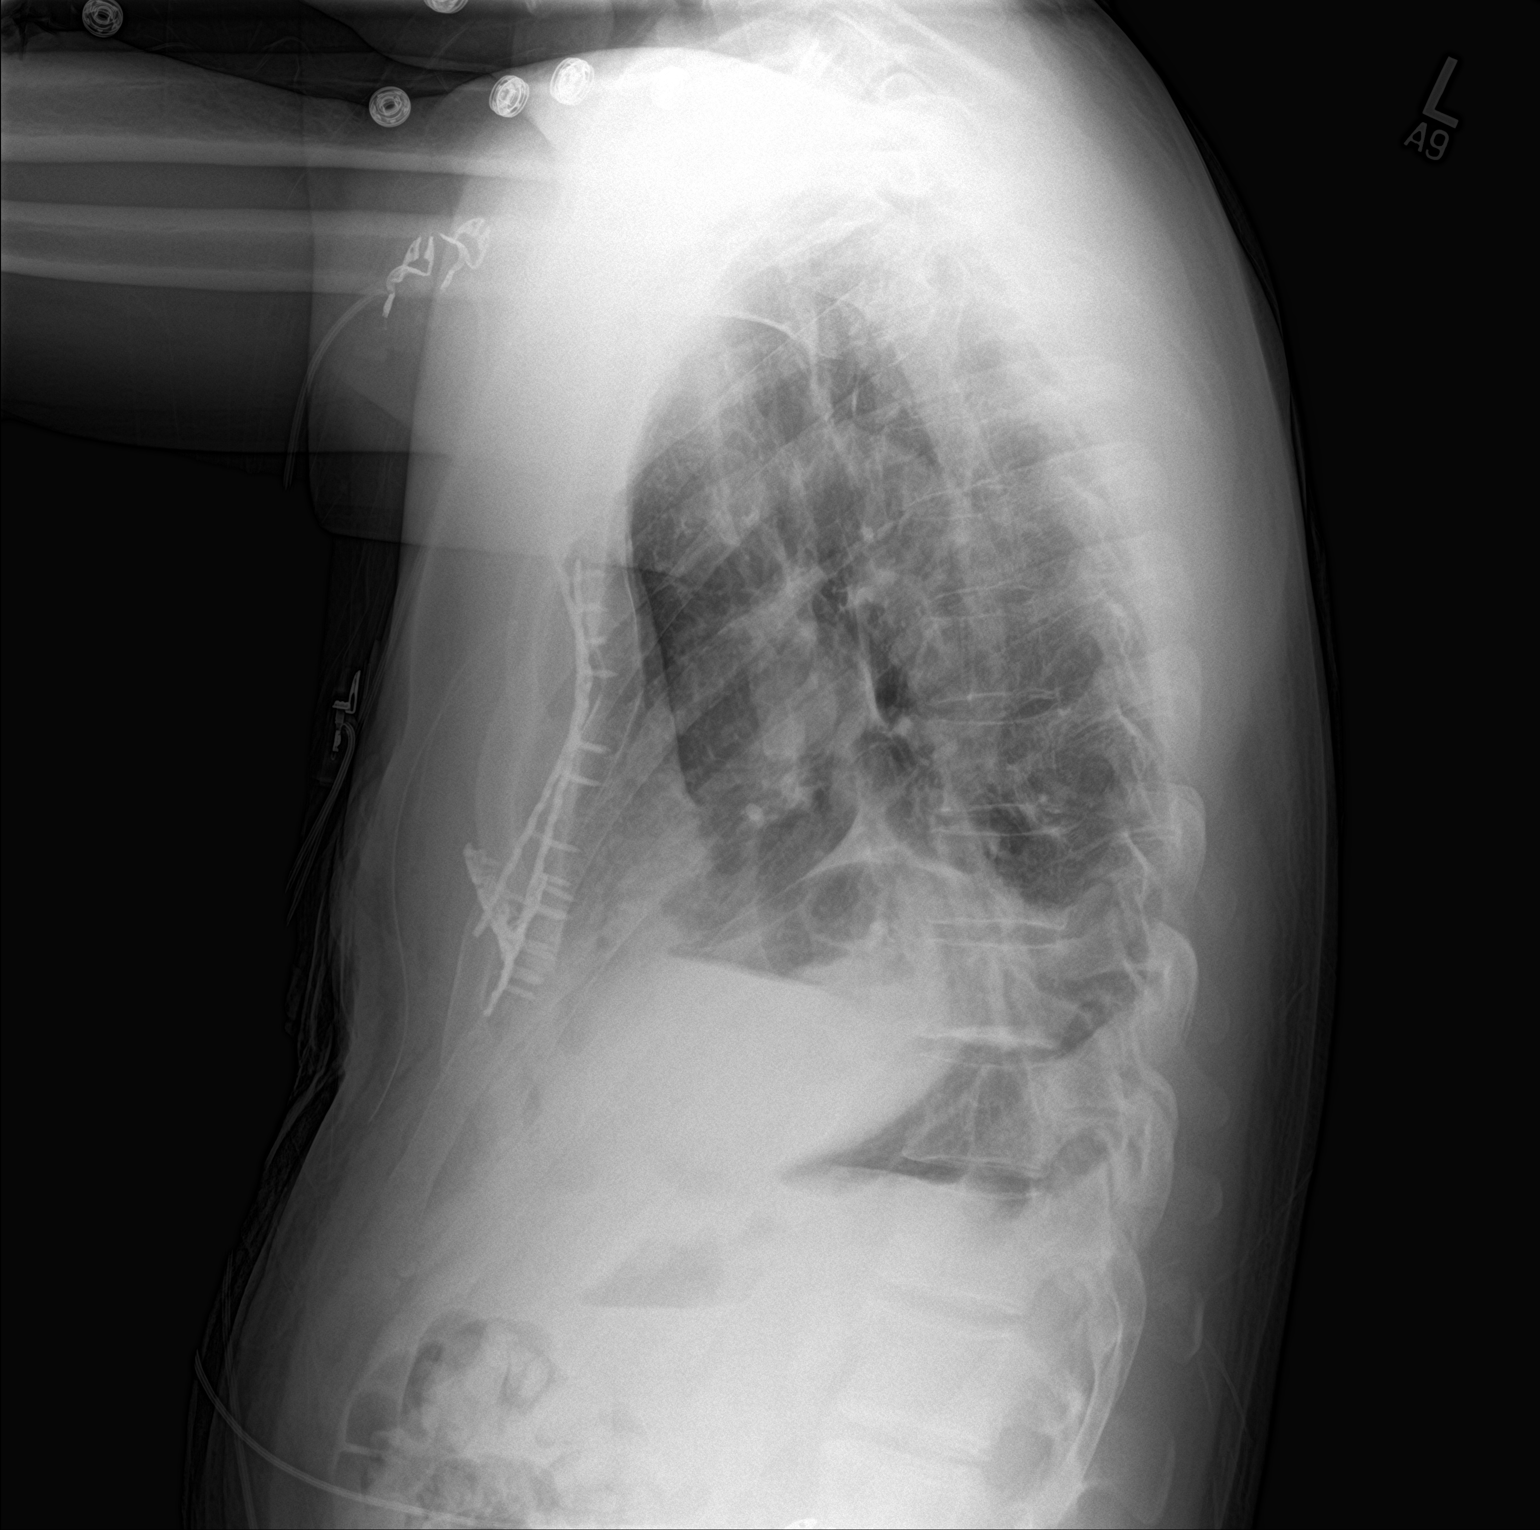

[2 of 2 positions shown; findings below may reference images not displayed]

FINDINGS: The cardiomediastinal silhouette is unchanged.

Plate and screw fixation of the sternum and anterior right fifth rib
noted.

Bibasilar atelectasis again noted, right greater than left.

There is no evidence of pneumothorax.

There has been little interval change since prior study except for
slightly improved lung volumes.
IMPRESSION: Slightly improved lung volumes without other significant change. No
evidence of pneumothorax. Continued bilateral lower lung
atelectasis.

## 2018-06-04 IMAGING — DX DG CHEST 2V
2 series · 2 of 2 positions shown · non-contrast
Comparison: March 31, 2017

CLINICAL DATA: Recent trauma

EXAM:
CHEST  2 VIEW

[dg chest 2 view (1 of 2)]
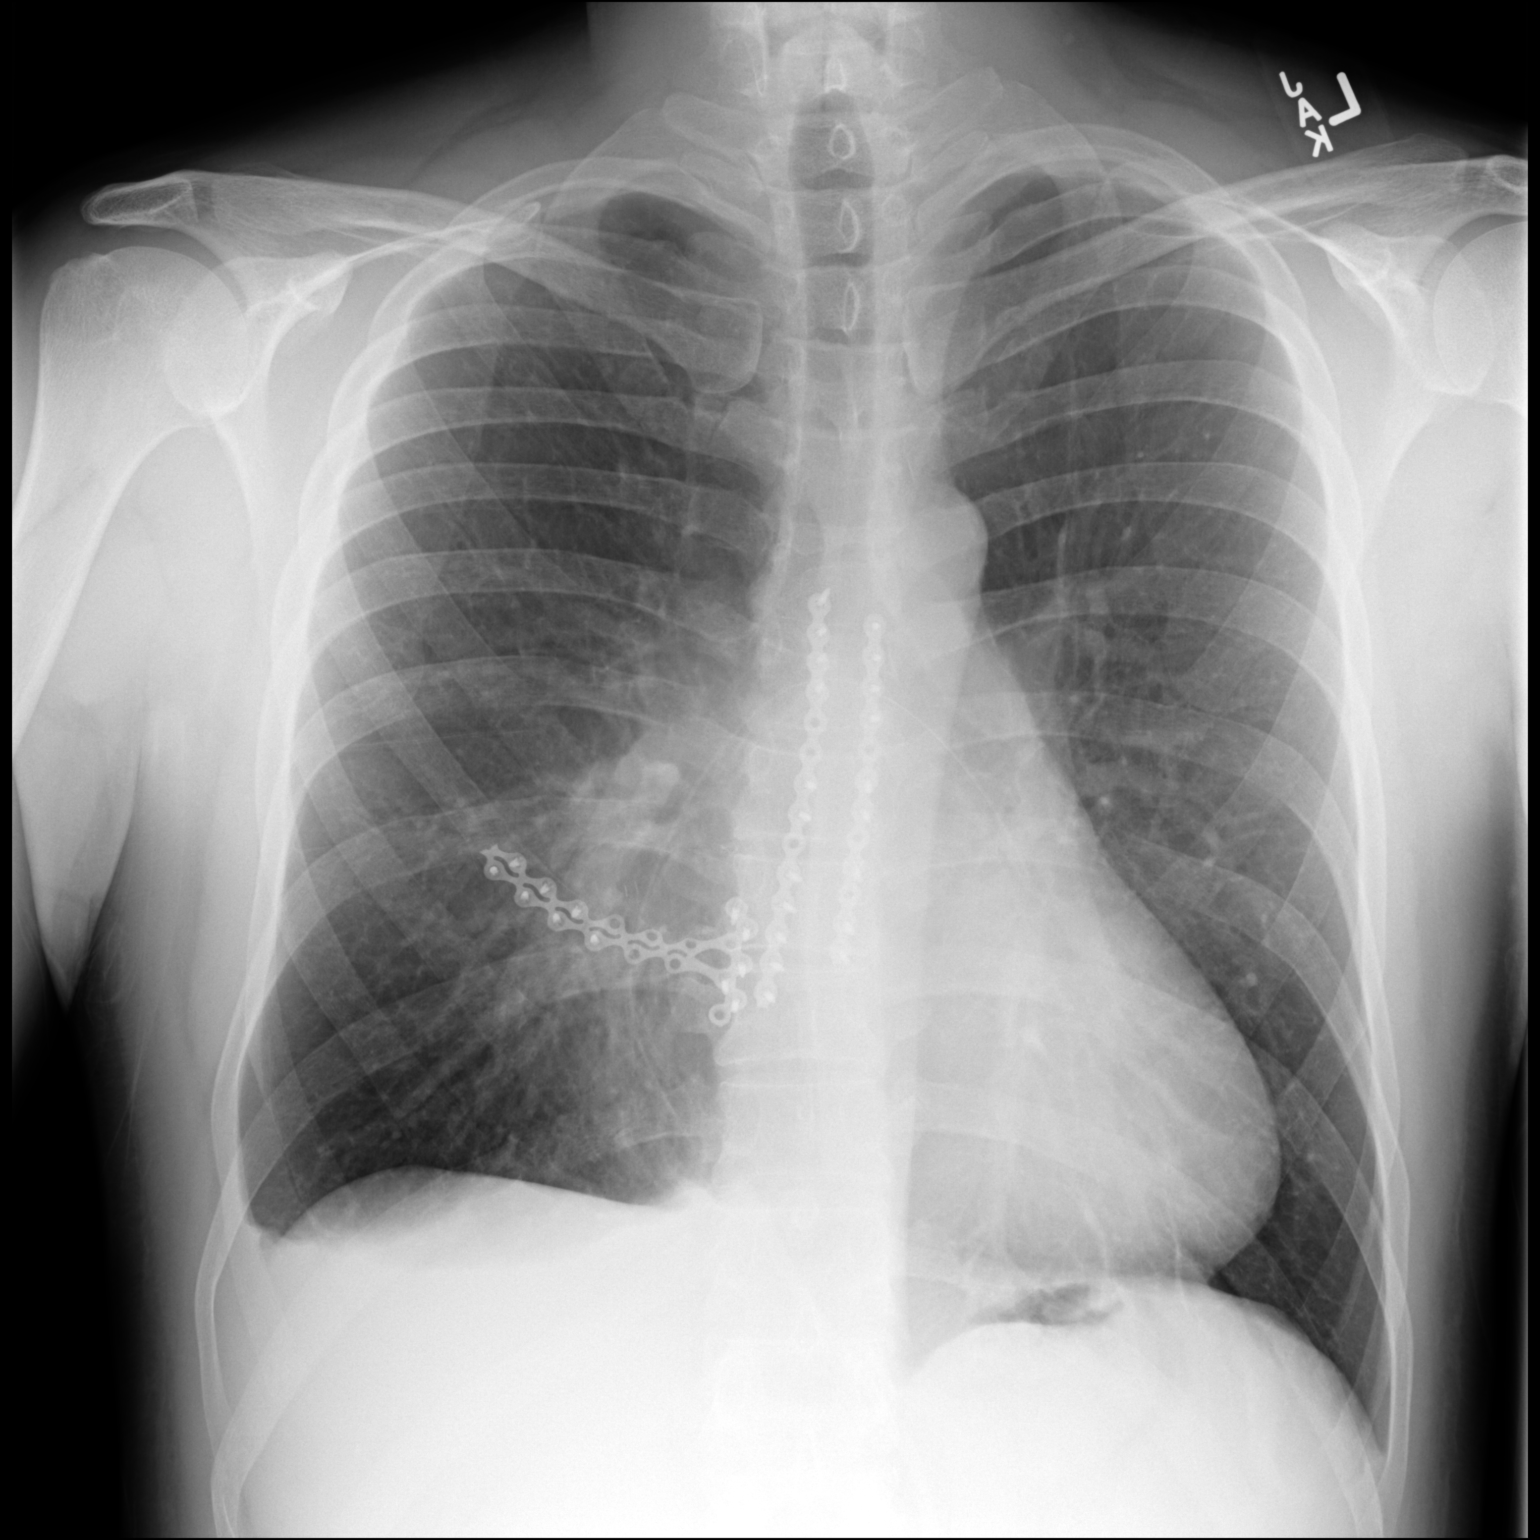

[dg chest 2 view (2 of 2)]
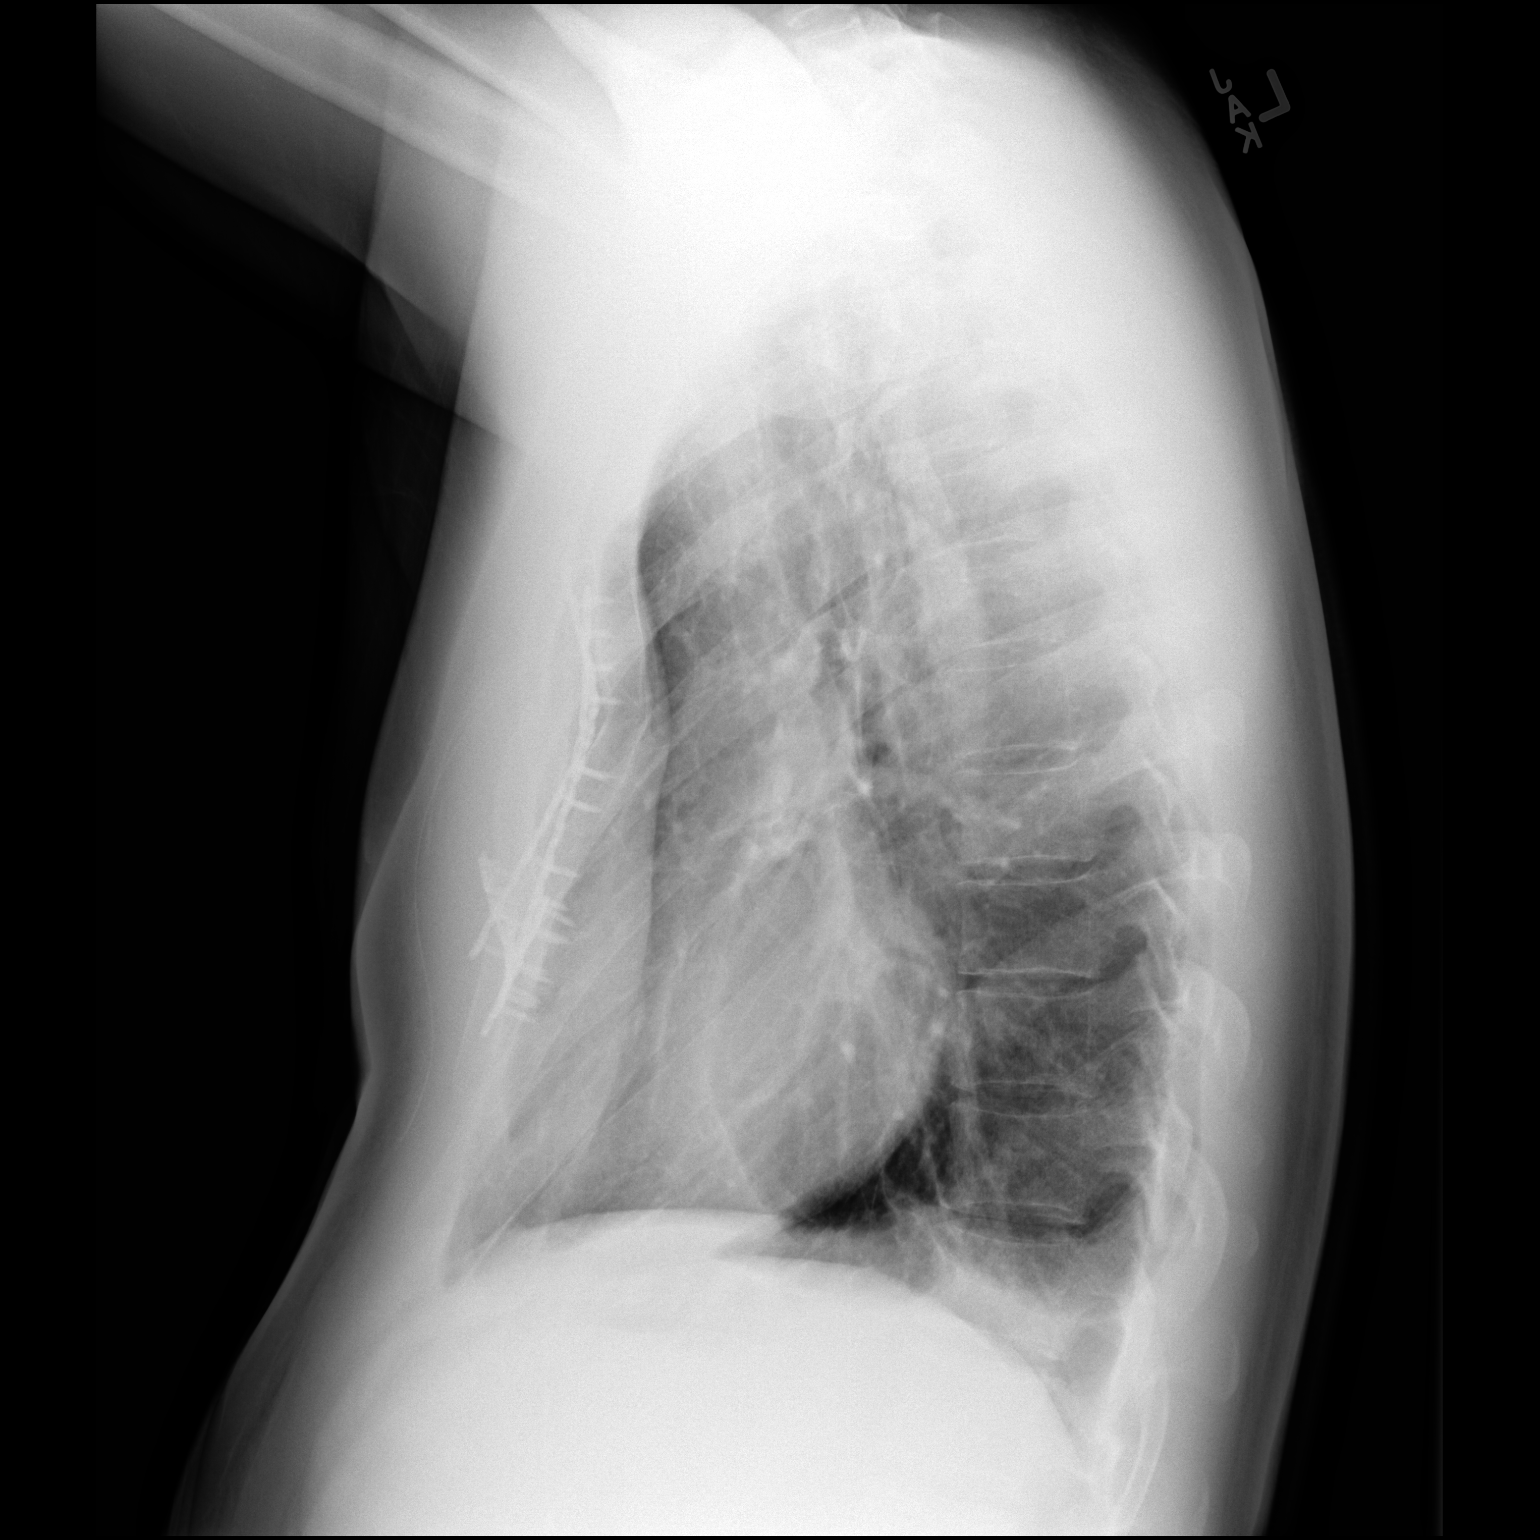

[2 of 2 positions shown; findings below may reference images not displayed]

FINDINGS: There is a small right pleural effusion with mild bibasilar
atelectasis. No edema or consolidation. No pneumothorax. There are
postoperative changes in the sternum and anterior right sixth rib.
Evidence of pectus excavatum repair with fracture in mid sternum,
presumed iatrogenic. Other bony structures appear normal. No
adenopathy.
IMPRESSION: Postoperative change. Small right pleural effusion with bibasilar
atelectasis. Lungs elsewhere clear. Cardiac silhouette within normal
limits.

## 2018-10-30 IMAGING — CR DG CHEST 2V
2 series · 2 of 2 positions shown · non-contrast
Comparison: June 02, 2017

CLINICAL DATA: Pectus excavatum surgery July 26, 2016. Chest
tightness.

EXAM:
CHEST - 2 VIEW

[w chest pa]
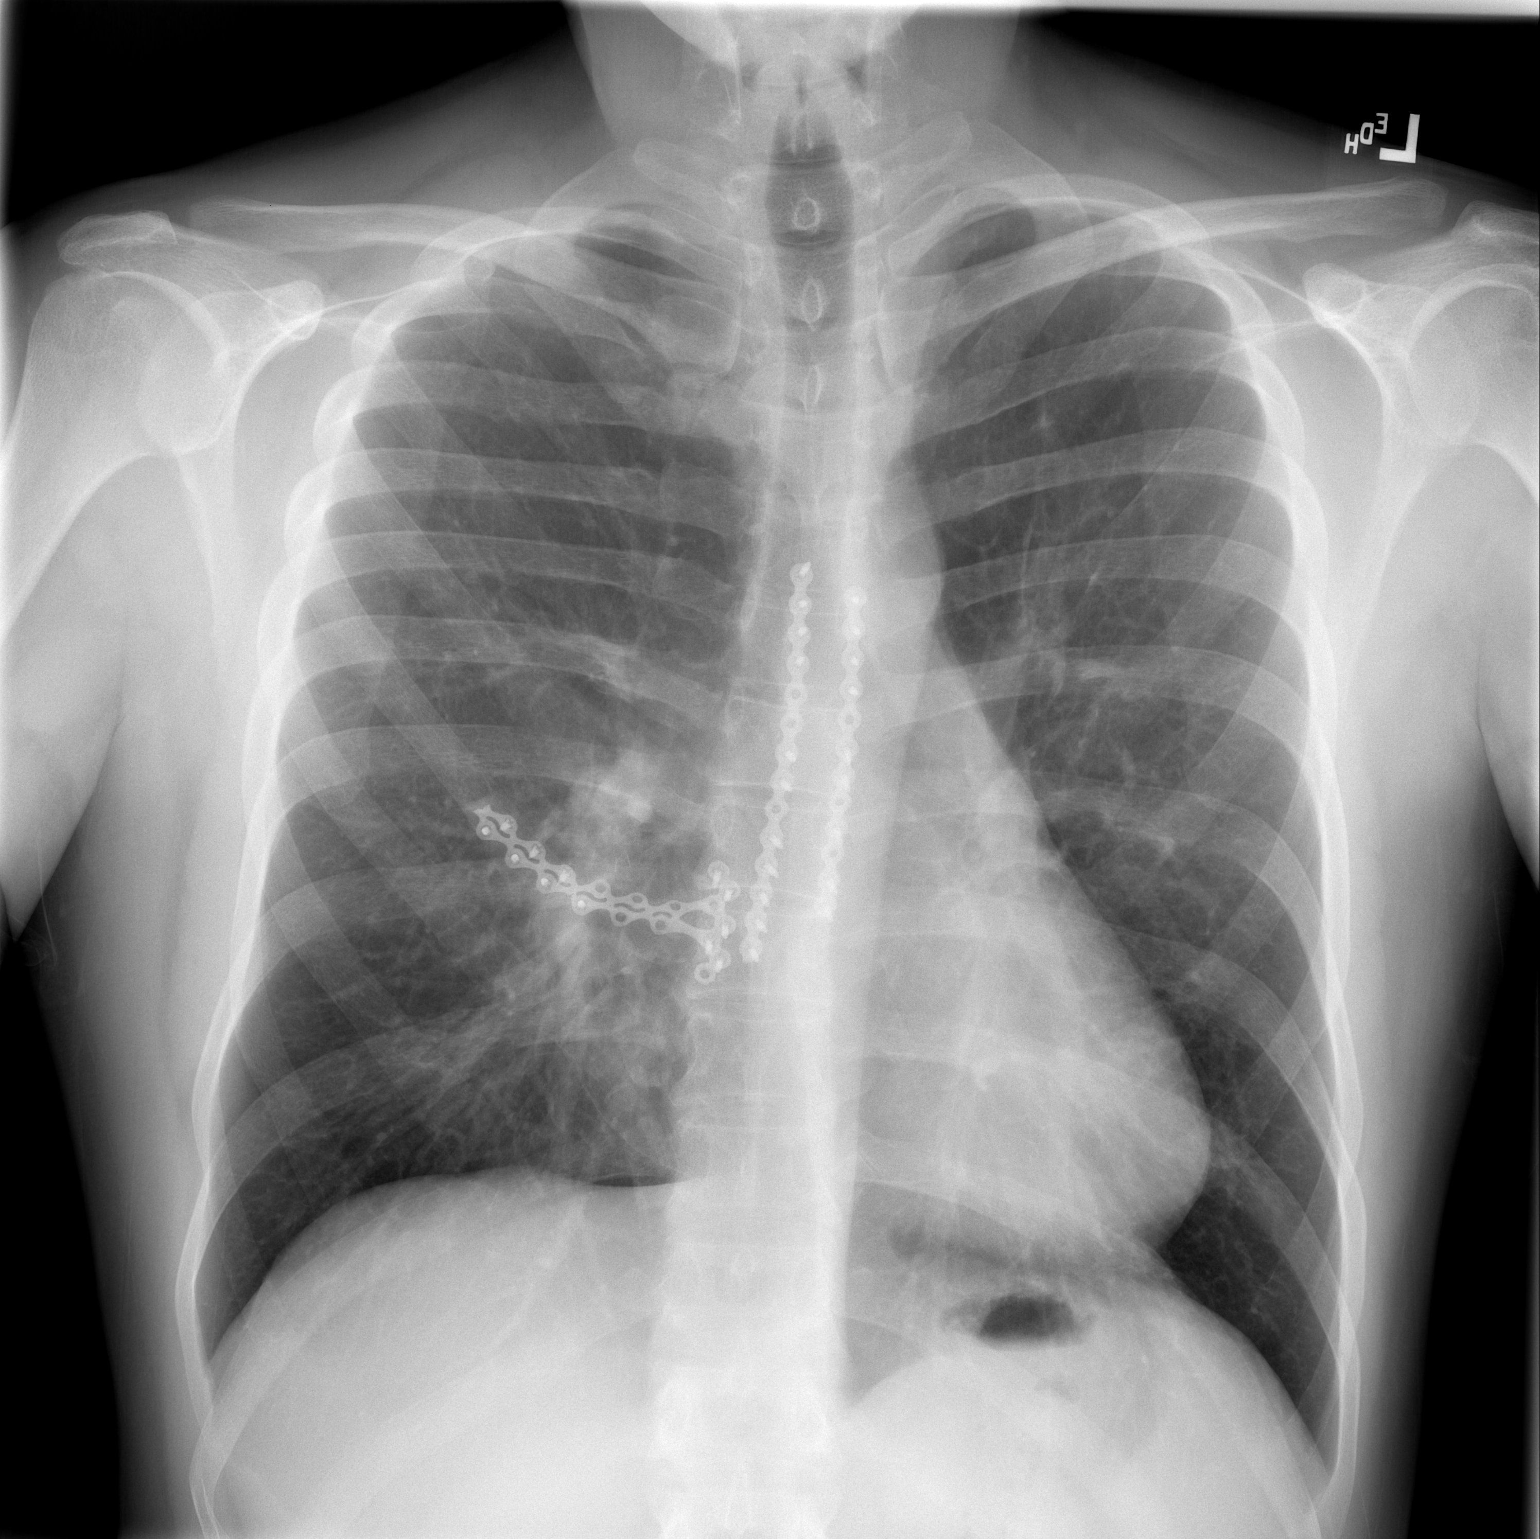

[w chest lat]
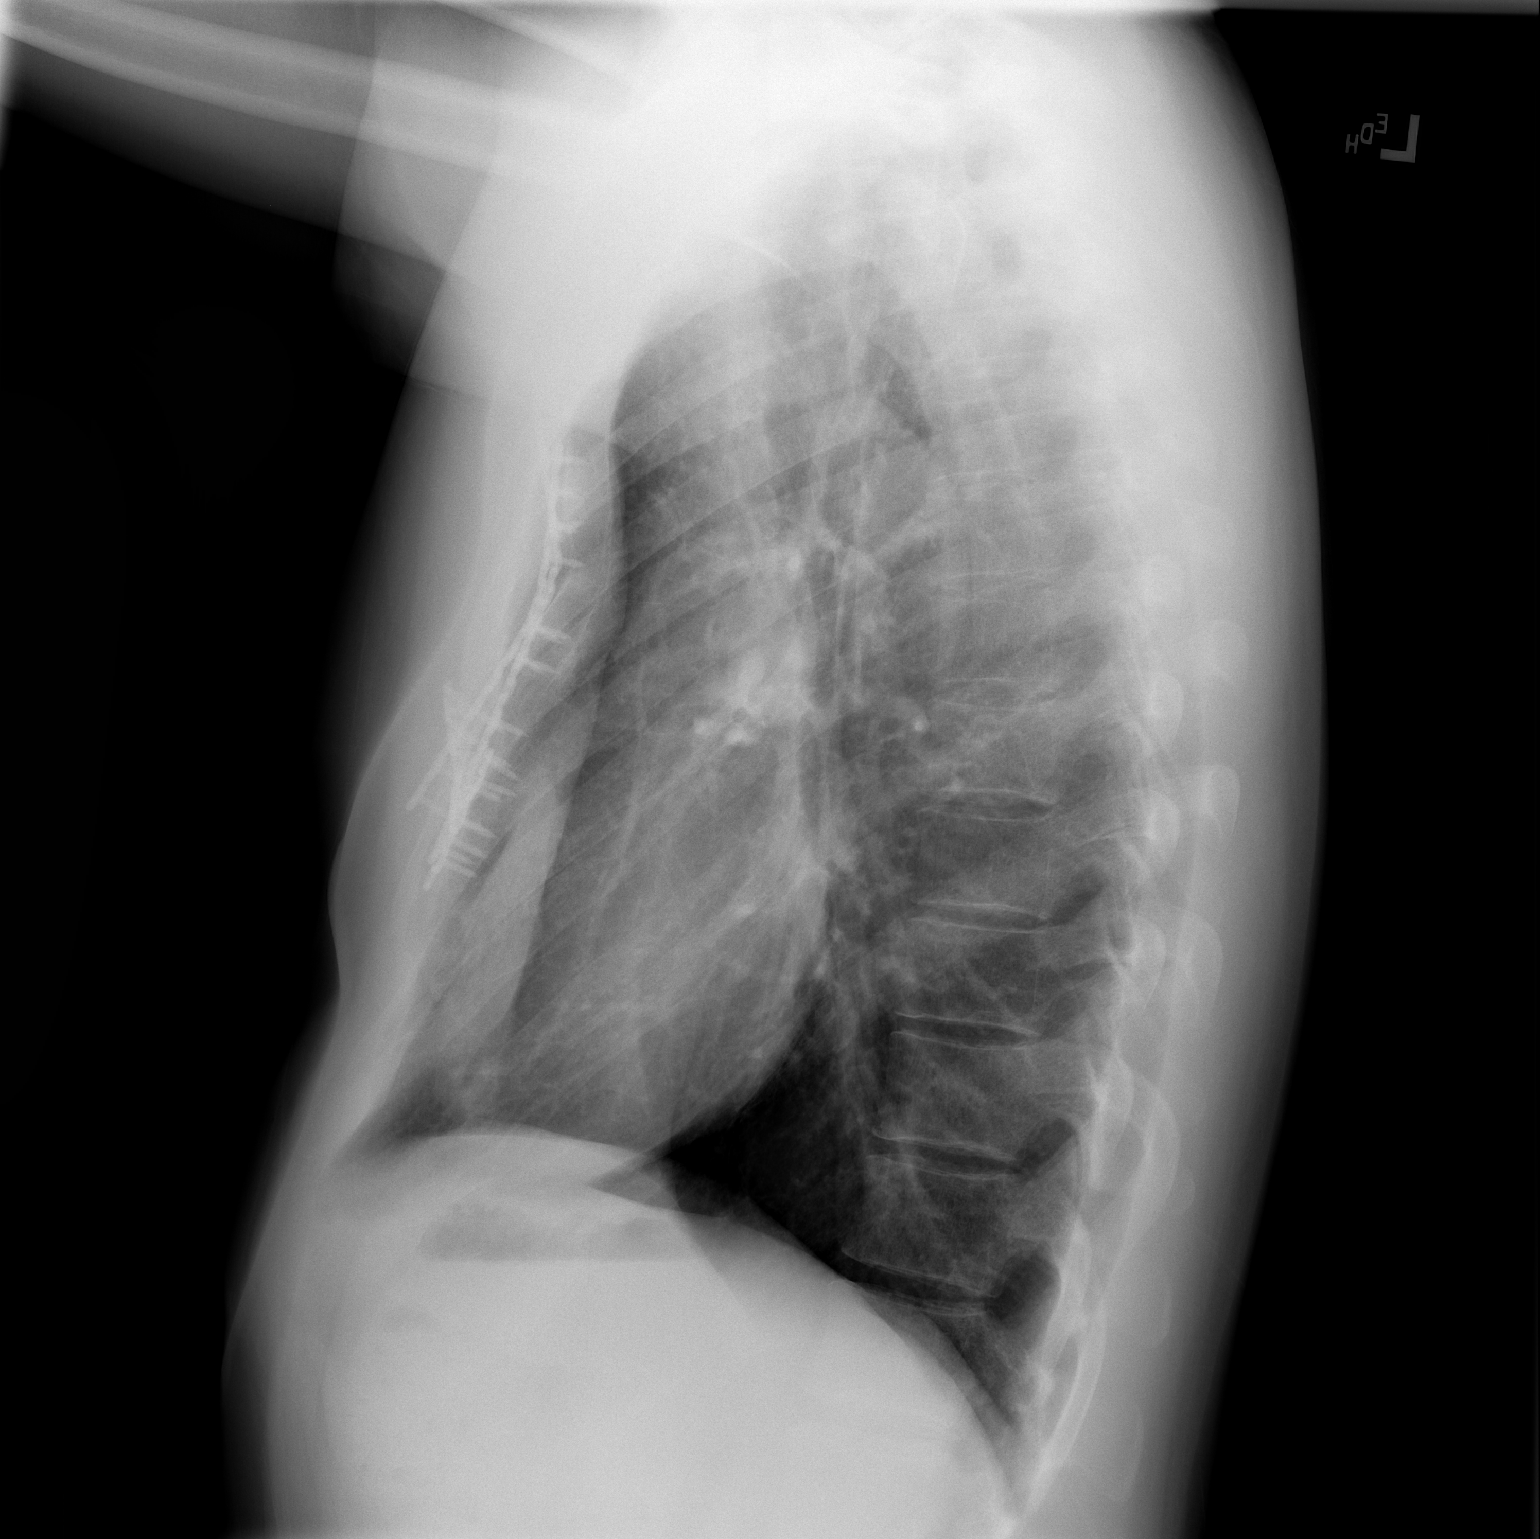

[2 of 2 positions shown; findings below may reference images not displayed]

FINDINGS: The heart, hila, and mediastinum are unchanged. Haziness in the
right perihilar region is stable and not acute. No pneumothorax. No
nodules or masses. No focal infiltrates. Surgical plates are stable.
No interval changes.
IMPRESSION: No active cardiopulmonary disease.

## 2019-01-31 ENCOUNTER — Other Ambulatory Visit: Payer: Self-pay | Admitting: Cardiothoracic Surgery

## 2019-01-31 DIAGNOSIS — Q676 Pectus excavatum: Secondary | ICD-10-CM

## 2019-03-08 ENCOUNTER — Ambulatory Visit: Payer: PRIVATE HEALTH INSURANCE | Admitting: Cardiothoracic Surgery

## 2019-03-08 NOTE — Progress Notes (Deleted)
[address]       [place] [address]             [phone]      Marcus Ellis [facility] Medical Record #[id number] Date of Birth: July 02, 1980  Referring: Laqueta Linden, MD Primary Care: Selinda Flavin, MD  Chief Complaint:   POST OP FOLLOW UP 03/28/2017 OPERATIVE REPORT PREOPERATIVE DIAGNOSIS:  Severe pectus excavatum. POSTOPERATIVE DIAGNOSIS:  Severe pectus excavatum. PROCEDURE:  Surgical repair of pectus excavatum by modified Ravitch technique with sternal plating. SURGEON:  Sheliah Plane, MD.   History of Present Illness:     Patient returns to the office in follow-up after surgery almost 2 years .  Symptomatically he is been doing well notes that his respiratory status is improved, recent echocardiogram showed an increase in ejection fraction.  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  Past Medical History:  Diagnosis Date  . ADD (attention deficit disorder)   . GERD (gastroesophageal reflux disease)   . Palpitations   . Pectus excavatum   . RBBB (right bundle branch block)   . Wears contact lenses   . Wears glasses      Social History   Tobacco Use  Smoking Status Former Smoker  . Packs/day: 1.00  . Years: 4.00  . Pack years: 4.00  Smokeless Tobacco Current User  . Types: Chew  Tobacco Comment   quit smoking in 2006    Social History   Substance and Sexual Activity  Alcohol Use Yes   Comment: occ     Allergies  Allergen Reactions  . Bee Venom Hives  . Penicillins Other (See Comments)    UNSPECIFIED REACTION AS A CHILD  Has patient had a PCN reaction causing immediate rash, facial/tongue/throat swelling, SOB or lightheadedness with hypotension: Unknown Has patient had a PCN reaction causing severe rash involving mucus membranes or skin necrosis: Unknown Has patient had a PCN reaction that  required hospitalization: Unknown Has patient had a PCN reaction occurring within the last 10 years: No If all of the above answers are "NO", then may proceed with Cephalosporin use.     Current Outpatient Medications  Medication Sig Dispense Refill  . amphetamine-dextroamphetamine (ADDERALL) 20 MG tablet Take 20 mg by mouth daily.    . calcium carbonate (TUMS - DOSED IN MG ELEMENTAL CALCIUM) 500 MG chewable tablet Chew 2 tablets by mouth 2 (two) times daily as needed for indigestion or heartburn.    Marland Kitchen ibuprofen (ADVIL,MOTRIN) 800 MG tablet Take 800 mg by mouth every 8 (eight) hours as needed for mild pain.    . sodium-potassium bicarbonate (ALKA-SELTZER GOLD) TBEF dissolvable tablet Take 1 tablet by mouth daily as needed (indigestion).     No current facility-administered medications for this visit.        Physical Exam: There were no vitals taken for this visit. {physical exam:21449} Diagnostic Studies & Laboratory data:     Recent Radiology Findings:   No results found.     Recent Lab Findings: Lab Results  Component Value Date   WBC 6.8 03/31/2017   HGB 11.2 (L) 03/31/2017   HCT 33.1 (L) 03/31/2017   PLT 178 03/31/2017   GLUCOSE 116 (H) 03/30/2017   ALT 18 03/30/2017   AST 23 03/30/2017   NA 131 (L) 03/30/2017   K 3.3 (L) 03/30/2017  CL 99 (L) 03/30/2017   CREATININE 0.92 03/30/2017   BUN 12 03/30/2017   CO2 26 03/30/2017   INR 1.04 03/24/2017      Assessment / Plan:     [name] MD      [address] [place] [address] Office [phone]   Beeper [phone]  03/08/2019 7:54 AM
